# Patient Record
Sex: Male | Born: 1970
Health system: Southern US, Community
[De-identification: ages and names within clinical notes are randomized; demographics above are authoritative.]

## PROBLEM LIST (undated history)

## (undated) DIAGNOSIS — K222 Esophageal obstruction: Secondary | ICD-10-CM

## (undated) DIAGNOSIS — F32A Depression, unspecified: Secondary | ICD-10-CM

## (undated) DIAGNOSIS — K219 Gastro-esophageal reflux disease without esophagitis: Secondary | ICD-10-CM

## (undated) DIAGNOSIS — E7800 Pure hypercholesterolemia, unspecified: Secondary | ICD-10-CM

## (undated) DIAGNOSIS — I319 Disease of pericardium, unspecified: Secondary | ICD-10-CM

## (undated) HISTORY — DX: Disease of pericardium, unspecified: I31.9

## (undated) HISTORY — DX: Depression, unspecified: F32.A

## (undated) HISTORY — PX: UPPER GASTROINTESTINAL ENDOSCOPY: SHX188

## (undated) HISTORY — DX: Gastro-esophageal reflux disease without esophagitis: K21.9

## (undated) HISTORY — DX: Esophageal obstruction: K22.2

## (undated) HISTORY — PX: RHINOPLASTY: SUR1284

---

## 2001-10-12 ENCOUNTER — Emergency Department (HOSPITAL_COMMUNITY): Admission: EM | Admit: 2001-10-12 | Discharge: 2001-10-12 | Payer: Self-pay | Admitting: Emergency Medicine

## 2013-09-29 ENCOUNTER — Other Ambulatory Visit: Payer: Self-pay | Admitting: Family Medicine

## 2013-09-29 DIAGNOSIS — R1310 Dysphagia, unspecified: Secondary | ICD-10-CM

## 2013-10-06 ENCOUNTER — Other Ambulatory Visit: Payer: Self-pay

## 2016-05-08 DIAGNOSIS — M9901 Segmental and somatic dysfunction of cervical region: Secondary | ICD-10-CM | POA: Diagnosis not present

## 2016-05-08 DIAGNOSIS — M531 Cervicobrachial syndrome: Secondary | ICD-10-CM | POA: Diagnosis not present

## 2016-05-08 DIAGNOSIS — M5137 Other intervertebral disc degeneration, lumbosacral region: Secondary | ICD-10-CM | POA: Diagnosis not present

## 2016-05-08 DIAGNOSIS — M5032 Other cervical disc degeneration, mid-cervical region, unspecified level: Secondary | ICD-10-CM | POA: Diagnosis not present

## 2016-05-12 DIAGNOSIS — M531 Cervicobrachial syndrome: Secondary | ICD-10-CM | POA: Diagnosis not present

## 2016-05-12 DIAGNOSIS — M9902 Segmental and somatic dysfunction of thoracic region: Secondary | ICD-10-CM | POA: Diagnosis not present

## 2016-05-12 DIAGNOSIS — M9903 Segmental and somatic dysfunction of lumbar region: Secondary | ICD-10-CM | POA: Diagnosis not present

## 2016-05-12 DIAGNOSIS — M9901 Segmental and somatic dysfunction of cervical region: Secondary | ICD-10-CM | POA: Diagnosis not present

## 2016-05-16 DIAGNOSIS — M9901 Segmental and somatic dysfunction of cervical region: Secondary | ICD-10-CM | POA: Diagnosis not present

## 2016-05-16 DIAGNOSIS — M9902 Segmental and somatic dysfunction of thoracic region: Secondary | ICD-10-CM | POA: Diagnosis not present

## 2016-05-16 DIAGNOSIS — M531 Cervicobrachial syndrome: Secondary | ICD-10-CM | POA: Diagnosis not present

## 2016-05-16 DIAGNOSIS — M9903 Segmental and somatic dysfunction of lumbar region: Secondary | ICD-10-CM | POA: Diagnosis not present

## 2016-05-21 DIAGNOSIS — M9902 Segmental and somatic dysfunction of thoracic region: Secondary | ICD-10-CM | POA: Diagnosis not present

## 2016-05-21 DIAGNOSIS — M531 Cervicobrachial syndrome: Secondary | ICD-10-CM | POA: Diagnosis not present

## 2016-05-21 DIAGNOSIS — M9901 Segmental and somatic dysfunction of cervical region: Secondary | ICD-10-CM | POA: Diagnosis not present

## 2016-05-21 DIAGNOSIS — M9903 Segmental and somatic dysfunction of lumbar region: Secondary | ICD-10-CM | POA: Diagnosis not present

## 2016-05-26 DIAGNOSIS — M531 Cervicobrachial syndrome: Secondary | ICD-10-CM | POA: Diagnosis not present

## 2016-05-26 DIAGNOSIS — M9903 Segmental and somatic dysfunction of lumbar region: Secondary | ICD-10-CM | POA: Diagnosis not present

## 2016-05-26 DIAGNOSIS — M9902 Segmental and somatic dysfunction of thoracic region: Secondary | ICD-10-CM | POA: Diagnosis not present

## 2016-05-28 DIAGNOSIS — J343 Hypertrophy of nasal turbinates: Secondary | ICD-10-CM | POA: Diagnosis not present

## 2016-05-28 DIAGNOSIS — R1314 Dysphagia, pharyngoesophageal phase: Secondary | ICD-10-CM | POA: Diagnosis not present

## 2016-05-28 DIAGNOSIS — J342 Deviated nasal septum: Secondary | ICD-10-CM | POA: Insufficient documentation

## 2016-05-28 DIAGNOSIS — K219 Gastro-esophageal reflux disease without esophagitis: Secondary | ICD-10-CM | POA: Diagnosis not present

## 2016-05-28 DIAGNOSIS — J309 Allergic rhinitis, unspecified: Secondary | ICD-10-CM | POA: Insufficient documentation

## 2016-05-30 DIAGNOSIS — M531 Cervicobrachial syndrome: Secondary | ICD-10-CM | POA: Diagnosis not present

## 2016-05-30 DIAGNOSIS — M9902 Segmental and somatic dysfunction of thoracic region: Secondary | ICD-10-CM | POA: Diagnosis not present

## 2016-05-30 DIAGNOSIS — M9901 Segmental and somatic dysfunction of cervical region: Secondary | ICD-10-CM | POA: Diagnosis not present

## 2016-05-30 DIAGNOSIS — M9903 Segmental and somatic dysfunction of lumbar region: Secondary | ICD-10-CM | POA: Diagnosis not present

## 2016-06-19 DIAGNOSIS — K219 Gastro-esophageal reflux disease without esophagitis: Secondary | ICD-10-CM | POA: Diagnosis not present

## 2016-06-19 DIAGNOSIS — J342 Deviated nasal septum: Secondary | ICD-10-CM | POA: Diagnosis not present

## 2016-09-16 DIAGNOSIS — H52223 Regular astigmatism, bilateral: Secondary | ICD-10-CM | POA: Diagnosis not present

## 2016-09-16 DIAGNOSIS — H5203 Hypermetropia, bilateral: Secondary | ICD-10-CM | POA: Diagnosis not present

## 2016-09-16 DIAGNOSIS — H524 Presbyopia: Secondary | ICD-10-CM | POA: Diagnosis not present

## 2016-10-16 DIAGNOSIS — Z23 Encounter for immunization: Secondary | ICD-10-CM | POA: Diagnosis not present

## 2016-10-28 ENCOUNTER — Encounter (HOSPITAL_COMMUNITY): Payer: Self-pay | Admitting: *Deleted

## 2016-10-28 ENCOUNTER — Observation Stay (HOSPITAL_BASED_OUTPATIENT_CLINIC_OR_DEPARTMENT_OTHER): Payer: BLUE CROSS/BLUE SHIELD

## 2016-10-28 ENCOUNTER — Observation Stay (HOSPITAL_COMMUNITY)
Admission: EM | Admit: 2016-10-28 | Discharge: 2016-10-29 | Disposition: A | Discharge status: Home / Self Care | Payer: BLUE CROSS/BLUE SHIELD | Attending: Cardiovascular Disease | Admitting: Cardiovascular Disease

## 2016-10-28 ENCOUNTER — Ambulatory Visit (HOSPITAL_COMMUNITY): Admit: 2016-10-28 | Payer: Self-pay | Admitting: Cardiovascular Disease

## 2016-10-28 ENCOUNTER — Encounter (HOSPITAL_COMMUNITY): Admission: EM | Disposition: A | Discharge status: Home / Self Care | Payer: Self-pay | Source: Home / Self Care

## 2016-10-28 DIAGNOSIS — I319 Disease of pericardium, unspecified: Secondary | ICD-10-CM

## 2016-10-28 DIAGNOSIS — F419 Anxiety disorder, unspecified: Secondary | ICD-10-CM | POA: Diagnosis not present

## 2016-10-28 DIAGNOSIS — E7800 Pure hypercholesterolemia, unspecified: Secondary | ICD-10-CM | POA: Diagnosis not present

## 2016-10-28 DIAGNOSIS — R9431 Abnormal electrocardiogram [ECG] [EKG]: Secondary | ICD-10-CM | POA: Diagnosis present

## 2016-10-28 DIAGNOSIS — I309 Acute pericarditis, unspecified: Principal | ICD-10-CM

## 2016-10-28 DIAGNOSIS — Z7982 Long term (current) use of aspirin: Secondary | ICD-10-CM | POA: Diagnosis not present

## 2016-10-28 DIAGNOSIS — I2119 ST elevation (STEMI) myocardial infarction involving other coronary artery of inferior wall: Secondary | ICD-10-CM | POA: Diagnosis present

## 2016-10-28 DIAGNOSIS — I213 ST elevation (STEMI) myocardial infarction of unspecified site: Secondary | ICD-10-CM | POA: Diagnosis not present

## 2016-10-28 DIAGNOSIS — R079 Chest pain, unspecified: Secondary | ICD-10-CM | POA: Diagnosis not present

## 2016-10-28 DIAGNOSIS — Z79899 Other long term (current) drug therapy: Secondary | ICD-10-CM | POA: Insufficient documentation

## 2016-10-28 HISTORY — DX: Pure hypercholesterolemia, unspecified: E78.00

## 2016-10-28 HISTORY — PX: LEFT HEART CATH AND CORONARY ANGIOGRAPHY: CATH118249

## 2016-10-28 LAB — DIFFERENTIAL
BASOS ABS: 0 10*3/uL (ref 0.0–0.1)
BASOS PCT: 0 %
EOS ABS: 0.1 10*3/uL (ref 0.0–0.7)
Eosinophils Relative: 1 %
Lymphocytes Relative: 18 %
Lymphs Abs: 1.6 10*3/uL (ref 0.7–4.0)
Monocytes Absolute: 0.9 10*3/uL (ref 0.1–1.0)
Monocytes Relative: 10 %
NEUTROS PCT: 71 %
Neutro Abs: 6.2 10*3/uL (ref 1.7–7.7)

## 2016-10-28 LAB — ECHOCARDIOGRAM COMPLETE
HEIGHTINCHES: 70 in
WEIGHTICAEL: 2960 [oz_av]

## 2016-10-28 LAB — LIPID PANEL
CHOL/HDL RATIO: 3.6 ratio
CHOLESTEROL: 197 mg/dL (ref 0–200)
HDL: 54 mg/dL (ref >40)
LDL Cholesterol: 131 mg/dL — ABNORMAL HIGH (ref 0–99)
Triglycerides: 60 mg/dL (ref <150)
VLDL: 12 mg/dL (ref 0–40)

## 2016-10-28 LAB — COMPREHENSIVE METABOLIC PANEL
ALT: 16 U/L — ABNORMAL LOW (ref 17–63)
AST: 20 U/L (ref 15–41)
Albumin: 4.5 g/dL (ref 3.5–5.0)
Alkaline Phosphatase: 53 U/L (ref 38–126)
Anion gap: 11 (ref 5–15)
BUN: 16 mg/dL (ref 6–20)
CHLORIDE: 102 mmol/L (ref 101–111)
CO2: 26 mmol/L (ref 22–32)
Calcium: 9.9 mg/dL (ref 8.9–10.3)
Creatinine, Ser: 0.96 mg/dL (ref 0.61–1.24)
Glucose, Bld: 102 mg/dL — ABNORMAL HIGH (ref 65–99)
POTASSIUM: 4.2 mmol/L (ref 3.5–5.1)
Sodium: 139 mmol/L (ref 135–145)
Total Bilirubin: 0.8 mg/dL (ref 0.3–1.2)
Total Protein: 6.8 g/dL (ref 6.5–8.1)

## 2016-10-28 LAB — CBC
HCT: 45.5 % (ref 39.0–52.0)
HEMOGLOBIN: 15.3 g/dL (ref 13.0–17.0)
MCH: 29.4 pg (ref 26.0–34.0)
MCHC: 33.6 g/dL (ref 30.0–36.0)
MCV: 87.5 fL (ref 78.0–100.0)
Platelets: 208 10*3/uL (ref 150–400)
RBC: 5.2 MIL/uL (ref 4.22–5.81)
RDW: 13.2 % (ref 11.5–15.5)
WBC: 8.8 10*3/uL (ref 4.0–10.5)

## 2016-10-28 LAB — PROTIME-INR
INR: 0.99
Prothrombin Time: 13.1 seconds (ref 11.4–15.2)

## 2016-10-28 LAB — TROPONIN I: Troponin I: <0.03 ng/mL (ref <0.03)

## 2016-10-28 LAB — APTT: aPTT: 29 seconds (ref 24–36)

## 2016-10-28 LAB — BRAIN NATRIURETIC PEPTIDE: B Natriuretic Peptide: 10.7 pg/mL (ref 0.0–100.0)

## 2016-10-28 SURGERY — LEFT HEART CATH AND CORONARY ANGIOGRAPHY

## 2016-10-28 MED ORDER — ACETAMINOPHEN 325 MG PO TABS: 650.0000 mg | ORAL_TABLET | ORAL | Status: DC | PRN

## 2016-10-28 MED ORDER — IOPAMIDOL (ISOVUE-370) INJECTION 76%
INTRAVENOUS | Status: DC | PRN
  Administered 2016-10-28: 85 mL via INTRA_ARTERIAL

## 2016-10-28 MED ORDER — IBUPROFEN 600 MG PO TABS
600.0000 mg | ORAL_TABLET | Freq: Three times a day (TID) | ORAL | Status: DC
  Administered 2016-10-28 – 2016-10-29 (×3): 600 mg via ORAL
  Filled 2016-10-28 (×3): qty 1

## 2016-10-28 MED ORDER — ASPIRIN EC 81 MG PO TBEC
81.0000 mg | DELAYED_RELEASE_TABLET | Freq: Every day | ORAL | Status: DC
  Administered 2016-10-29: 81 mg via ORAL
  Filled 2016-10-28: qty 1

## 2016-10-28 MED ORDER — FENTANYL CITRATE (PF) 100 MCG/2ML IJ SOLN
INTRAMUSCULAR | Status: AC
  Filled 2016-10-28: qty 2

## 2016-10-28 MED ORDER — LIDOCAINE HCL (PF) 1 % IJ SOLN
INTRAMUSCULAR | Status: AC
  Filled 2016-10-28: qty 30

## 2016-10-28 MED ORDER — SODIUM CHLORIDE 0.9 % IV SOLN: 250.0000 mL | INTRAVENOUS | Status: DC | PRN

## 2016-10-28 MED ORDER — HEPARIN SODIUM (PORCINE) 1000 UNIT/ML IJ SOLN
INTRAMUSCULAR | Status: DC | PRN
  Administered 2016-10-28: 4000 [IU] via INTRAVENOUS

## 2016-10-28 MED ORDER — ASPIRIN 81 MG PO CHEW
324.0000 mg | CHEWABLE_TABLET | Freq: Once | ORAL | Status: AC
  Administered 2016-10-28: 324 mg via ORAL
  Filled 2016-10-28: qty 4

## 2016-10-28 MED ORDER — DIAZEPAM 5 MG PO TABS: 5.0000 mg | ORAL_TABLET | ORAL | Status: DC | PRN

## 2016-10-28 MED ORDER — HEPARIN (PORCINE) IN NACL 2-0.9 UNIT/ML-% IJ SOLN
INTRAMUSCULAR | Status: AC
  Filled 2016-10-28: qty 1000

## 2016-10-28 MED ORDER — FENTANYL CITRATE (PF) 100 MCG/2ML IJ SOLN
INTRAMUSCULAR | Status: DC | PRN
  Administered 2016-10-28: 25 ug via INTRAVENOUS

## 2016-10-28 MED ORDER — LIDOCAINE HCL (PF) 1 % IJ SOLN
INTRAMUSCULAR | Status: DC | PRN
  Administered 2016-10-28: 2 mL

## 2016-10-28 MED ORDER — HEPARIN SODIUM (PORCINE) 5000 UNIT/ML IJ SOLN
5000.0000 [IU] | Freq: Three times a day (TID) | INTRAMUSCULAR | Status: DC
  Administered 2016-10-28 – 2016-10-29 (×2): 5000 [IU] via SUBCUTANEOUS
  Filled 2016-10-28 (×2): qty 1

## 2016-10-28 MED ORDER — HEPARIN (PORCINE) IN NACL 2-0.9 UNIT/ML-% IJ SOLN
INTRAMUSCULAR | Status: DC | PRN
  Administered 2016-10-28: 1500 mL

## 2016-10-28 MED ORDER — HEPARIN SODIUM (PORCINE) 1000 UNIT/ML IJ SOLN
INTRAMUSCULAR | Status: AC
  Filled 2016-10-28: qty 1

## 2016-10-28 MED ORDER — SODIUM CHLORIDE 0.9 % WEIGHT BASED INFUSION: 1.0000 mL/kg/h | INTRAVENOUS | Status: AC

## 2016-10-28 MED ORDER — HEPARIN SODIUM (PORCINE) 5000 UNIT/ML IJ SOLN
4000.0000 [IU] | Freq: Once | INTRAMUSCULAR | Status: AC
  Administered 2016-10-28: 4000 [IU] via INTRAVENOUS

## 2016-10-28 MED ORDER — IOPAMIDOL (ISOVUE-370) INJECTION 76%
INTRAVENOUS | Status: AC
  Filled 2016-10-28: qty 125

## 2016-10-28 MED ORDER — MIDAZOLAM HCL 2 MG/2ML IJ SOLN
INTRAMUSCULAR | Status: DC | PRN
  Administered 2016-10-28: 2 mg via INTRAVENOUS

## 2016-10-28 MED ORDER — MORPHINE SULFATE (PF) 2 MG/ML IV SOLN
2.0000 mg | INTRAVENOUS | Status: DC | PRN
  Administered 2016-10-28 (×2): 2 mg via INTRAVENOUS
  Filled 2016-10-28: qty 2
  Filled 2016-10-28 (×2): qty 1

## 2016-10-28 MED ORDER — PANTOPRAZOLE SODIUM 40 MG PO TBEC
40.0000 mg | DELAYED_RELEASE_TABLET | Freq: Every day | ORAL | Status: DC
  Administered 2016-10-28 – 2016-10-29 (×2): 40 mg via ORAL
  Filled 2016-10-28 (×2): qty 1

## 2016-10-28 MED ORDER — ONDANSETRON HCL 4 MG/2ML IJ SOLN: 4.0000 mg | Freq: Four times a day (QID) | INTRAMUSCULAR | Status: DC | PRN

## 2016-10-28 MED ORDER — VERAPAMIL HCL 2.5 MG/ML IV SOLN
INTRAVENOUS | Status: AC
  Filled 2016-10-28: qty 2

## 2016-10-28 MED ORDER — SODIUM CHLORIDE 0.9% FLUSH: 3.0000 mL | INTRAVENOUS | Status: DC | PRN

## 2016-10-28 MED ORDER — HEPARIN (PORCINE) IN NACL 2-0.9 UNIT/ML-% IJ SOLN
INTRAMUSCULAR | Status: AC
  Filled 2016-10-28: qty 500

## 2016-10-28 MED ORDER — MIDAZOLAM HCL 2 MG/2ML IJ SOLN
INTRAMUSCULAR | Status: AC
Start: 2016-10-28 — End: 2016-10-28
  Filled 2016-10-28: qty 2

## 2016-10-28 MED ORDER — NITROGLYCERIN 0.4 MG SL SUBL: 0.4000 mg | SUBLINGUAL_TABLET | SUBLINGUAL | Status: DC | PRN

## 2016-10-28 MED ORDER — SODIUM CHLORIDE 0.9% FLUSH
3.0000 mL | Freq: Two times a day (BID) | INTRAVENOUS | Status: DC
  Administered 2016-10-28 – 2016-10-29 (×2): 3 mL via INTRAVENOUS

## 2016-10-28 MED ORDER — VERAPAMIL HCL 2.5 MG/ML IV SOLN
INTRAVENOUS | Status: DC | PRN
  Administered 2016-10-28: 10 mL via INTRA_ARTERIAL

## 2016-10-28 MED ORDER — NITROGLYCERIN 1 MG/10 ML FOR IR/CATH LAB
INTRA_ARTERIAL | Status: AC
  Filled 2016-10-28: qty 10

## 2016-10-28 MED ORDER — HEPARIN SODIUM (PORCINE) 5000 UNIT/ML IJ SOLN
INTRAMUSCULAR | Status: AC
  Filled 2016-10-28: qty 1

## 2016-10-28 SURGICAL SUPPLY — 11 items
CATH 5FR JL3.5 JR4 ANG PIG MP (CATHETERS) ×1 IMPLANT
DEVICE RAD COMP TR BAND LRG (VASCULAR PRODUCTS) ×1 IMPLANT
GLIDESHEATH SLEND SS 6F .021 (SHEATH) ×1 IMPLANT
GUIDEWIRE INQWIRE 1.5J.035X260 (WIRE) IMPLANT
INQWIRE 1.5J .035X260CM (WIRE) ×2
KIT ENCORE 26 ADVANTAGE (KITS) ×1 IMPLANT
KIT HEART LEFT (KITS) ×2 IMPLANT
PACK CARDIAC CATHETERIZATION (CUSTOM PROCEDURE TRAY) ×2 IMPLANT
SYR MEDRAD MARK V 150ML (SYRINGE) ×2 IMPLANT
TRANSDUCER W/STOPCOCK (MISCELLANEOUS) ×2 IMPLANT
TUBING CIL FLEX 10 FLL-RA (TUBING) ×2 IMPLANT

## 2016-10-28 NOTE — ED Triage Notes (Signed)
Pt. Reports having center chest pain waking him up around 100 am.  Pt. Describes the pain as a pressure and a heaviness.  Pt. Denies any radiating pain or any other symptoms.  Pt. Was able to go back to sleep, but this morning continues to have the pain and a deep breath increases the pain.  Skin is p/w/d.  Resp. E/u.  Alert and oriented X4.   ECG done in triage

## 2016-10-28 NOTE — Progress Notes (Signed)
  Echocardiogram 2D Echocardiogram has been performed.  Marc Weber 10/28/2016, 3:43 PM

## 2016-10-28 NOTE — ED Notes (Signed)
Marc Weber with stemi team at bedside

## 2016-10-28 NOTE — Progress Notes (Signed)
Patient c/o of pain in the chest 4/10. Cath clean from today. Called MD. New orders given and implemented. Will continue to monitor.   Karley Pho, Mervin Kung RN

## 2016-10-28 NOTE — Progress Notes (Signed)
TR BAND REMOVAL  LOCATION:    Right radial   DEFLATED PER PROTOCOL:    Yes.    TIME BAND OFF / DRESSING APPLIED:    1300p   SITE UPON ARRIVAL:    Level 0  SITE AFTER BAND REMOVAL:    Level 0  CIRCULATION SENSATION AND MOVEMENT:    Within Normal Limits   Yes.    COMMENTS:   Right radial 2+ pulse.  Pt denies any discomfort at this time.

## 2016-10-28 NOTE — ED Notes (Signed)
ED Provider at bedside. 

## 2016-10-28 NOTE — H&P (Signed)
History and Physical  Patient ID: ZENITH WILL MRN: [id number], SOB: 04-14-1971 46 y.o. Date of Encounter: 10/28/2016, 10:14 AM  Primary Physician: No primary care provider on file. Primary Cardiologist: new  Chief Complaint: Chest pain  HPI: 46 y.o. male w/ PMHx who presented to [facility] on 10/28/2016 with complaints of Chest pain. The patient reports intermittent chest discomfort now for the past few weeks. Last night he developed severe substernal chest discomfort that he has difficulty characterizing. His pain lasted approximately one hour then resolved. Pain recurred this morning and is ongoing. There is no associated shortness of breath. There was associated nausea but no vomiting. No diaphoresis, lightheadedness, or syncope. The patient has no history of coronary artery disease, hypertension, tobacco use, or family history of CAD. He thinks he has had hypercholesterolemia but has never been treated. On arrival to the emergency department his EKG demonstrated inferolateral ST elevation consistent with an acute STEMI and a code STEMI was activated. He presents now to the cardiac catheterization lab for emergency cardiac catheterization and possible PCI. He is chest pain-free at the time of my evaluation.   Past Medical History:  Diagnosis Date  . Hypercholesteremia      Surgical History:  Past Surgical History:  Procedure Laterality Date  . RHINOPLASTY       Home Meds: Prior to Admission medications   Not on File    Allergies: No Known Allergies  Social History   Social History  . Marital status: Single    Spouse name: N/A  . Number of children: N/A  . Years of education: N/A   Occupational History  . Not on file.   Social History Main Topics  . Smoking status: Not on file  . Smokeless tobacco: Not on file  . Alcohol use Not on file  . Drug use: Unknown  . Sexual activity: Not on file   Other Topics Concern  . Not on file   Social History  Narrative  . No narrative on file     Family Hx: negative for CAD in any first degree family members  Review of Systems: General: negative for chills, fever, night sweats or weight changes.  ENT: negative for rhinorrhea or epistaxis Cardiovascular:see HPI Dermatological: negative for rash Respiratory: negative for cough or wheezing GI: negative for vomiting, diarrhea, bright red blood per rectum, melena, or hematemesis GU: no hematuria, urgency, or frequency Neurologic: negative for visual changes, syncope, headache, or dizziness Heme: no easy bruising or bleeding Endo: negative for excessive thirst, thyroid disorder, or flushing Musculoskeletal: negative for joint pain or swelling, negative for myalgias All other systems reviewed and are otherwise negative except as noted above.  Physical Exam: Blood pressure 104/68, pulse 65, temperature 97.9 F (36.6 C), resp. rate 18, height 5\' 10"  (1.778 m), weight 185 lb (83.9 kg), SpO2 (!) 0 %. General: Well developed, well nourished, alert and oriented, in no acute distress. HEENT: Normocephalic, atraumatic, sclera anicteric Neck: Supple. Carotids 2+ without bruits. JVP normal Lungs: Clear bilaterally to auscultation without wheezes, rales, or rhonchi. Breathing is unlabored. Heart: RRR with normal S1 and S2. No murmurs, rubs, or gallops appreciated. Abdomen: Soft, non-tender, non-distended with normoactive bowel sounds. No hepatomegaly. No rebound/guarding. No obvious abdominal masses. Back: No CVA tenderness Msk:  Strength and tone appear normal for age. Extremities: No clubbing, cyanosis, or edema.  Distal pedal pulses are 2+ and equal bilaterally. Neuro: CNII-XII intact, moves all extremities spontaneously. Psych:  Responds to questions appropriately  with a normal affect. Skin: warm and dry without rash   Labs:   Lab Results  Component Value Date   WBC 8.8 10/28/2016   HGB 15.3 10/28/2016   HCT 45.5 10/28/2016   MCV 87.5  10/28/2016   PLT 208 10/28/2016   No results for input(s): NA, K, CL, CO2, BUN, CREATININE, CALCIUM, PROT, BILITOT, ALKPHOS, ALT, AST, GLUCOSE in the last 168 hours.  Invalid input(s): LABALBU No results for input(s): CKTOTAL, CKMB, TROPONINI in the last 72 hours. No results found for: CHOL, HDL, LDLCALC, TRIG No results found for: DDIMER  Radiology/Studies:  No results found.   EKG: NSR with inferior/anterolateral ST elevation  CARDIAC STUDIES: pending  ASSESSMENT AND PLAN:  46 year old gentleman with chest pain syndrome and EKG suggestive of inferolateral STEMI. Differential diagnosis includes acute pericarditis, coronary vasospasm, or typical ACS with plaque rupture event. Considering his fairly marked ST segment elevation, emergency cardiac catheterization and possible PCI is indicated. I have reviewed the risks, indications, and alternatives with the patient. Emergency implied consent is obtained. All questions are answered.  The patient has received heparin 4000 units in the emergency department. He is received chewable aspirin 324 mg. He is chest pain-free at the time of my evaluation in the cardiac catheterization lab. Anticipate serial cardiac markers, lipid panel tomorrow morning, and post-MI medical therapy as indicated. Further disposition pending cardiac catheterization results.  Deatra James MD 10/28/2016, 10:14 AM

## 2016-10-28 NOTE — Progress Notes (Addendum)
Patient oriented into the room.  TR band site level 0. Vitals cycling. No issues at present.   Eleaner Dibartolo, Mervin Kung RN   Time was

## 2016-10-28 NOTE — ED Provider Notes (Signed)
[facility] DEPT Provider Note   CSN: [id number] Arrival date & time: 10/28/16  0911     History   Chief Complaint Chief Complaint  Patient presents with  . Chest Pain    HPI Marc Weber is a 46 y.o. male.  HPI Patient developed chest pain that awakened him and the night at 1 AM. He reports it was a pressure and heaviness in the center of his chest under the sternum. He denies it radiated or other symptoms were associated. He reports that he did go back to sleep. He reports this morning when he got up however the pain was still present. He reports is somewhat worse with a deep breath. He denies syncope or lightheadedness. He denies history of prior chest pain or any medical problems. He does take 1 daily morning medication for stress and anxiety but no other medications. He did try ibuprofen yesterday evening and this morning for the pain. He denies any history of bleeding disorder, GI bleeding, stroke. History reviewed. No pertinent past medical history.  There are no active problems to display for this patient.   History reviewed. No pertinent surgical history.     Home Medications    Prior to Admission medications   Not on File    Family History No family history on file.  Social History Social History  Substance Use Topics  . Smoking status: Not on file  . Smokeless tobacco: Not on file  . Alcohol use Not on file     Allergies   Patient has no known allergies.   Review of Systems Review of Systems 10 Systems reviewed and are negative for acute change except as noted in the HPI.   Physical Exam Updated Vital Signs BP 133/87 (BP Location: Right Arm)   Pulse 70   Temp 97.9 F (36.6 C)   Ht 5\' 10"  (1.778 m)   Wt 185 lb (83.9 kg)   BMI 26.54 kg/m   Physical Exam  Constitutional: He is oriented to person, place, and time. He appears well-developed and well-nourished.  HENT:  Head: Normocephalic and atraumatic.  Eyes: Conjunctivae and EOM  are normal.  Neck: Neck supple.  Cardiovascular: Normal rate, regular rhythm and normal heart sounds.   No murmur heard. Pulmonary/Chest: Effort normal and breath sounds normal. No respiratory distress.  Abdominal: Soft. There is no tenderness.  Musculoskeletal: He exhibits no edema or tenderness.  Neurological: He is alert and oriented to person, place, and time. No cranial nerve deficit. He exhibits normal muscle tone. Coordination normal.  Skin: Skin is warm and dry.  Psychiatric: He has a normal mood and affect.  Nursing note and vitals reviewed.    ED Treatments / Results  Labs (all labs ordered are listed, but only abnormal results are displayed) Labs Reviewed  CBC  DIFFERENTIAL  PROTIME-INR  APTT  COMPREHENSIVE METABOLIC PANEL  TROPONIN I  LIPID PANEL    EKG  EKG Interpretation  Date/Time:  Tuesday October 28 2016 09:18:20 EST Ventricular Rate:  75 PR Interval:  148 QRS Duration: 106 QT Interval:  388 QTC Calculation: 433 R Axis:   57 Text Interpretation:   Critical Test Result: STEMI Normal sinus rhythm ST elevation consider inferolateral injury or acute infarct  ACUTE MI / STEMI  Abnormal ECG agree. no old comparison Confirmed by Johnney Killian, MD, Jeannie Done [phone]) on 10/28/2016 9:38:52 AM       Radiology No results found.  Procedures Procedures (including critical care time) CRITICAL CARE Performed by: Charlesetta Shanks  Total critical care time: 30 minutes  Critical care time was exclusive of separately billable procedures and treating other patients.  Critical care was necessary to treat or prevent imminent or life-threatening deterioration.  Critical care was time spent personally by me on the following activities: development of treatment plan with patient and/or surrogate as well as nursing, discussions with consultants, evaluation of patient's response to treatment, examination of patient, obtaining history from patient or surrogate, ordering and  performing treatments and interventions, ordering and review of laboratory studies, ordering and review of radiographic studies, pulse oximetry and re-evaluation of patient's condition. Medications Ordered in ED Medications  heparin 5000 UNIT/ML injection (not administered)  aspirin chewable tablet 324 mg (324 mg Oral Given 10/28/16 0930)  heparin injection 4,000 Units (4,000 Units Intravenous Given 10/28/16 0930)     Initial Impression / Assessment and Plan / ED Course  I have reviewed the triage vital signs and the nursing notes.  Pertinent labs & imaging results that were available during my care of the patient were reviewed by me and considered in my medical decision making (see chart for details).     Consult:code STEMI  Final Clinical Impressions(s) / ED Diagnoses   Final diagnoses:  ST elevation myocardial infarction (STEMI), unspecified artery [facility])   Patient presents with no past medical history and acute onset of chest pain that awoke him from sleep. EKG is positive for inferior lateral STEMI. Patient is alert and appropriate. He rates his chest pain at a 1 at this time. He does not exhibit respiratory distress or blood pressure instability. Aspirin and heparin are initiated and patient is transfer to cath lab. New Prescriptions There are no discharge medications for this patient.    Charlesetta Shanks, MD 10/28/16 [phone]

## 2016-10-29 ENCOUNTER — Encounter (HOSPITAL_COMMUNITY): Payer: Self-pay | Admitting: *Deleted

## 2016-10-29 ENCOUNTER — Observation Stay (HOSPITAL_COMMUNITY): Payer: BLUE CROSS/BLUE SHIELD

## 2016-10-29 DIAGNOSIS — E7800 Pure hypercholesterolemia, unspecified: Secondary | ICD-10-CM | POA: Diagnosis not present

## 2016-10-29 DIAGNOSIS — R0602 Shortness of breath: Secondary | ICD-10-CM | POA: Diagnosis not present

## 2016-10-29 DIAGNOSIS — I301 Infective pericarditis: Secondary | ICD-10-CM

## 2016-10-29 DIAGNOSIS — I309 Acute pericarditis, unspecified: Secondary | ICD-10-CM

## 2016-10-29 DIAGNOSIS — F419 Anxiety disorder, unspecified: Secondary | ICD-10-CM | POA: Diagnosis not present

## 2016-10-29 DIAGNOSIS — Z7982 Long term (current) use of aspirin: Secondary | ICD-10-CM | POA: Diagnosis not present

## 2016-10-29 LAB — HEMOGLOBIN A1C
HEMOGLOBIN A1C: 5.3 % (ref 4.8–5.6)
MEAN PLASMA GLUCOSE: 105 mg/dL

## 2016-10-29 LAB — CBC
HEMATOCRIT: 41.1 % (ref 39.0–52.0)
HEMOGLOBIN: 13.6 g/dL (ref 13.0–17.0)
MCH: 29.2 pg (ref 26.0–34.0)
MCHC: 33.1 g/dL (ref 30.0–36.0)
MCV: 88.4 fL (ref 78.0–100.0)
Platelets: 181 10*3/uL (ref 150–400)
RBC: 4.65 MIL/uL (ref 4.22–5.81)
RDW: 13.6 % (ref 11.5–15.5)
WBC: 6.2 10*3/uL (ref 4.0–10.5)

## 2016-10-29 LAB — BASIC METABOLIC PANEL
ANION GAP: 7 (ref 5–15)
BUN: 11 mg/dL (ref 6–20)
CHLORIDE: 105 mmol/L (ref 101–111)
CO2: 27 mmol/L (ref 22–32)
Calcium: 9 mg/dL (ref 8.9–10.3)
Creatinine, Ser: 0.9 mg/dL (ref 0.61–1.24)
GFR calc Af Amer: >60 mL/min (ref >60)
Glucose, Bld: 101 mg/dL — ABNORMAL HIGH (ref 65–99)
POTASSIUM: 3.9 mmol/L (ref 3.5–5.1)
SODIUM: 139 mmol/L (ref 135–145)

## 2016-10-29 LAB — LIPID PANEL
Cholesterol: 172 mg/dL (ref 0–200)
HDL: 48 mg/dL (ref >40)
LDL CALC: 115 mg/dL — AB (ref 0–99)
Total CHOL/HDL Ratio: 3.6 RATIO
Triglycerides: 46 mg/dL (ref <150)
VLDL: 9 mg/dL (ref 0–40)

## 2016-10-29 LAB — HIV ANTIBODY (ROUTINE TESTING W REFLEX): HIV Screen 4th Generation wRfx: NONREACTIVE

## 2016-10-29 LAB — POCT I-STAT, CHEM 8
BUN: 18 mg/dL (ref 6–20)
CALCIUM ION: 1.23 mmol/L (ref 1.15–1.40)
Chloride: 105 mmol/L (ref 101–111)
Creatinine, Ser: 0.8 mg/dL (ref 0.61–1.24)
Glucose, Bld: 113 mg/dL — ABNORMAL HIGH (ref 65–99)
HCT: 42 % (ref 39.0–52.0)
Hemoglobin: 14.3 g/dL (ref 13.0–17.0)
Potassium: 3.8 mmol/L (ref 3.5–5.1)
Sodium: 140 mmol/L (ref 135–145)
TCO2: 25 mmol/L (ref 0–100)

## 2016-10-29 LAB — TROPONIN I

## 2016-10-29 LAB — SEDIMENTATION RATE: Sed Rate: 17 mm/hr — ABNORMAL HIGH (ref 0–16)

## 2016-10-29 MED ORDER — IBUPROFEN 600 MG PO TABS: 600.0000 mg | ORAL_TABLET | Freq: Three times a day (TID) | ORAL | 0 refills | Status: DC

## 2016-10-29 MED ORDER — COLCHICINE 0.6 MG PO TABS
0.6000 mg | ORAL_TABLET | Freq: Every day | ORAL | Status: DC
  Administered 2016-10-29: 0.6 mg via ORAL
  Filled 2016-10-29: qty 1

## 2016-10-29 MED ORDER — COLCHICINE 0.6 MG PO TABS: 0.6000 mg | ORAL_TABLET | Freq: Every day | ORAL | 0 refills | Status: DC

## 2016-10-29 MED ORDER — TUBERCULIN PPD 5 UNIT/0.1ML ID SOLN
5.0000 [IU] | Freq: Once | INTRADERMAL | Status: DC
  Filled 2016-10-29: qty 0.1

## 2016-10-29 MED ORDER — OMEPRAZOLE 20 MG PO CPDR
20.0000 mg | DELAYED_RELEASE_CAPSULE | Freq: Every day | ORAL | 1 refills | Status: DC
Start: 2016-10-29 — End: 2017-09-14

## 2016-10-29 MED FILL — Nitroglycerin IV Soln 100 MCG/ML in D5W: INTRA_ARTERIAL | Qty: 10 | Status: AC

## 2016-10-29 NOTE — Discharge Instructions (Signed)
Radial Site Care °Refer to this sheet in the next few weeks. These instructions provide you with information about caring for yourself after your procedure. Your health care provider may also give you more specific instructions. Your treatment has been planned according to current medical practices, but problems sometimes occur. Call your health care provider if you have any problems or questions after your procedure. °What can I expect after the procedure? °After your procedure, it is typical to have the following: °· Bruising at the radial site that usually fades within 1-2 weeks. °· Blood collecting in the tissue (hematoma) that may be painful to the touch. It should usually decrease in size and tenderness within 1-2 weeks. °Follow these instructions at home: °· Take medicines only as directed by your health care provider. °· You may shower 24-48 hours after the procedure or as directed by your health care provider. Remove the bandage (dressing) and gently wash the site with plain soap and water. Pat the area dry with a clean towel. Do not rub the site, because this may cause bleeding. °· Do not take baths, swim, or use a hot tub until your health care provider approves. °· Check your insertion site every day for redness, swelling, or drainage. °· Do not apply powder or lotion to the site. °· Do not flex or bend the affected arm for 24 hours or as directed by your health care provider. °· Do not push or pull heavy objects with the affected arm for 24 hours or as directed by your health care provider. °· Do not lift over 10 lb (4.5 kg) for 5 days after your procedure or as directed by your health care provider. °· Ask your health care provider when it is okay to: °¨ Return to work or school. °¨ Resume usual physical activities or sports. °¨ Resume sexual activity. °· Do not drive home if you are discharged the same day as the procedure. Have someone else drive you. °· You may drive 24 hours after the procedure  unless otherwise instructed by your health care provider. °· Do not operate machinery or power tools for 24 hours after the procedure. °· If your procedure was done as an outpatient procedure, which means that you went home the same day as your procedure, a responsible adult should be with you for the first 24 hours after you arrive home. °· Keep all follow-up visits as directed by your health care provider. This is important. °Contact a health care provider if: °· You have a fever. °· You have chills. °· You have increased bleeding from the radial site. Hold pressure on the site. °Get help right away if: °· You have unusual pain at the radial site. °· You have redness, warmth, or swelling at the radial site. °· You have drainage (other than a small amount of blood on the dressing) from the radial site. °· The radial site is bleeding, and the bleeding does not stop after 30 minutes of holding steady pressure on the site. °· Your arm or hand becomes pale, cool, tingly, or numb. °This information is not intended to replace advice given to you by your health care provider. Make sure you discuss any questions you have with your health care provider. °Document Released: 09/20/2010 Document Revised: 01/24/2016 Document Reviewed: 03/06/2014 °Elsevier Interactive Patient Education © 2017 [employer] ° °

## 2016-10-29 NOTE — Progress Notes (Signed)
Progress Note  Patient Name: Marc Weber Date of Encounter: 10/29/2016  Primary Cardiologist: Burt Knack (new)  Subjective   Still with mild, lingering substernal discomfort. Much improved compared to admission symptoms. No set over the past 6 months he has had several flares of a similar type discomfort but never as intense as upon this admission.  He denies articular complaints, chills, fever, and other significant medical problems.  Inpatient Medications    Scheduled Meds: . aspirin EC  81 mg Oral Daily  . heparin  5,000 Units Subcutaneous Q8H  . ibuprofen  600 mg Oral TID  . pantoprazole  40 mg Oral Daily  . sodium chloride flush  3 mL Intravenous Q12H   Continuous Infusions:  PRN Meds: sodium chloride, acetaminophen, diazepam, morphine injection, nitroGLYCERIN, ondansetron (ZOFRAN) IV, sodium chloride flush   Vital Signs    Vitals:   10/28/16 1310 10/28/16 1337 10/28/16 2028 10/29/16 0447  BP: 112/62 (!) 103/53 108/72 100/60  Pulse: 76 67 61 62  Resp: 17 20 18 18   Temp:  98.2 F (36.8 C) 98.1 F (36.7 C) 98 F (36.7 C)  TempSrc:  Oral Oral Oral  SpO2: 97% 97% 97% 96%  Weight:      Height:        Intake/Output Summary (Last 24 hours) at 10/29/16 0726 Last data filed at 10/28/16 2100  Gross per 24 hour  Intake           819.42 ml  Output                0 ml  Net           819.42 ml   Filed Weights   10/28/16 0929  Weight: 185 lb (83.9 kg)    Telemetry    Normal sinus rhythm without ectopy - Personally Reviewed  ECG    Normal sinus rhythm with mild diffuse ST elevation. Marked improvement compared with the initial EKG done on 10/28/2016. - Personally Reviewed  Physical Exam  Healthy appearing young male GEN: No acute distress.   Neck: No JVD Cardiac: RRR, no murmurs, rubs, or gallops.  Respiratory: Clear to auscultation bilaterally. GI: Soft, nontender, non-distended  MS: No edema; No deformity. Neuro:  Nonfocal  Psych: Normal affect    Labs    Chemistry Recent Labs Lab 10/28/16 0925 10/29/16 0234  NA 139 139  K 4.2 3.9  CL 102 105  CO2 26 27  GLUCOSE 102* 101*  BUN 16 11  CREATININE 0.96 0.90  CALCIUM 9.9 9.0  PROT 6.8  --   ALBUMIN 4.5  --   AST 20  --   ALT 16*  --   ALKPHOS 53  --   BILITOT 0.8  --   GFRNONAA >60 >60  GFRAA >60 >60  ANIONGAP 11 7     Hematology Recent Labs Lab 10/28/16 0925 10/29/16 0234  WBC 8.8 6.2  RBC 5.20 4.65  HGB 15.3 13.6  HCT 45.5 41.1  MCV 87.5 88.4  MCH 29.4 29.2  MCHC 33.6 33.1  RDW 13.2 13.6  PLT 208 181    Cardiac Enzymes Recent Labs Lab 10/28/16 0925 10/28/16 1712 10/28/16 2135 10/29/16 0234  TROPONINI <0.03 <0.03 <0.03 <0.03   No results for input(s): TROPIPOC in the last 168 hours.   BNP Recent Labs Lab 10/28/16 1712  BNP 10.7     DDimer No results for input(s): DDIMER in the last 168 hours.   Radiology    No results found.  Cardiac Studies   Coronary angio 10/28/16: Diagnostic Diagram      Echocardiogram 10/28/16: Study Conclusions  - Left ventricle: The cavity size was normal. Systolic function was   normal. The estimated ejection fraction was in the range of 60%   to 65%. Wall motion was normal; there were no regional wall   motion abnormalities. Left ventricular diastolic function   parameters were normal. - Mitral valve: Mildly thickened leaflets with hypermobility of the   anterior leaflet. There is no significant regurgitation. - Left atrium: The atrium was mildly dilated. - Right atrium: The atrium was mildly dilated. - Inferior vena cava: The vessel was normal in size. The   respirophasic diameter changes were in the normal range (>= 50%),   consistent with normal central venous pressure. - Pericardium, extracardiac: There was no pericardial effusion.  Impressions:  - LVEF 60-65%, normal wall motion, normal diastolic function, mild   mitral leaflet thickening with hypermobility of the anterior   leaflet,  mild biatrial enlargement, no pericardial effusion or   obvious pericardial thickening.   Patient Profile     46 y.o. male with sudden onset of chest discomfort and EKG compatible with acute pericarditis.  Assessment & Plan    1. Acute pericarditis with improving EKG and symptoms on nonsteroidal anti-inflammatory therapy. Plan is to ambulate today and discharge later today if stable.  Will get a PA and lateral chest x-ray, draw an autoimmune panel, and add colchicine which should be continued for 3 months. 600 mg of ibuprofen 3 times daily with meals should be taken for 10 days. Would not require cardiology follow-up unless recurrent symptoms. Follow-up with PCP, Dr. Mechele Collin at [facility], Sinclair Grooms, MD  10/29/2016, 7:26 AM

## 2016-10-29 NOTE — Discharge Summary (Signed)
The patient has been seen in conjunction with Delos Haring, PAC. All aspects of care have been considered and discussed. The patient has been personally interviewed, examined, and all clinical data has been reviewed.   Acute pericarditis  Plan Ibuprofen(10 days) and low dose colchicine (3 months).  Serology and TB blood work completed. Results pending at discharge.  Discharge Summary    Patient ID: Marc Weber,  MRN: [id number], DOB/AGE: [birth year] 46 y.o.  Admit date: 10/28/2016 Discharge date: 10/29/2016  Primary Care Provider: Dr. Mechele Collin at [facility] Primary Cardiologist:  Burt Knack (new)   Discharge Diagnoses    Active Problems:   Acute pericarditis  Allergies No Known Allergies   History of Present Illness     46 y.o. male w/ PMHx who presented to [facility] on 10/28/2016 with complaints of Chest pain. The patient reported intermittent chest discomfort at that time and for the past few weeks. On 2/26 he developed severe substernal chest discomfort that he had difficulty characterizing. His pain lasted approximately one hour then resolved. Pain recurred Tuesday morning and was ongoing. There was no associated shortness of breath. There was associated nausea but no vomiting. No diaphoresis, lightheadedness, or syncope. The patient has no history of coronary artery disease, hypertension, tobacco use, or family history of CAD. He thinks he has had hypercholesterolemia but has never been treated. On arrival to the emergency department his EKG demonstrated inferolateral ST elevation consistent with an acute STEMI and a code STEMI was activated.   Hospital Course      He went for emergent cardiac catheterization lab for emergency cardiac catheterization which ultimately demonstrated patent coronary arteries. His symptoms were felt to be due to acute pericarditis. A 2D echo performed, LVEF 60-65% with mild mitral leaflet thickening with hypermobility of the  anterior leaflet, mild biatrial enlargement, otherwise normal. This morning she is having some mild chest discomfort but is much improved and has remained stable.  Dr. Linard Millers has seen the patient and feels he is ready for discharge home. Dr. Tamala Julian feels he does not require cardiology follow-up unless symptoms reoccur, otherwise he can follow-up with her PCP with [facility]. Plan is to start on a Colchicine and Ibuprofen regimen, TB and Autoimmune panel sent out. A work excuse note was provided as well. Discharge medications are listed below.   Consultants: None  _____________  Discharge Vitals Blood pressure 100/60, pulse 62, temperature 98 F (36.7 C), temperature source Oral, resp. rate 18, height 5\' 10"  (1.778 m), weight 185 lb (83.9 kg), SpO2 96 %.  Filed Weights   10/28/16 0929  Weight: 185 lb (83.9 kg)    Labs & Radiologic Studies     CBC  Recent Labs  10/28/16 0925 10/29/16 0234  WBC 8.8 6.2  NEUTROABS 6.2  --   HGB 15.3 13.6  HCT 45.5 41.1  MCV 87.5 88.4  PLT 208 0000000   Basic Metabolic Panel  Recent Labs  10/28/16 0925 10/29/16 0234  NA 139 139  K 4.2 3.9  CL 102 105  CO2 26 27  GLUCOSE 102* 101*  BUN 16 11  CREATININE 0.96 0.90  CALCIUM 9.9 9.0   Liver Function Tests  Recent Labs  10/28/16 0925  AST 20  ALT 16*  ALKPHOS 53  BILITOT 0.8  PROT 6.8  ALBUMIN 4.5   Cardiac Enzymes  Recent Labs  10/28/16 1712 10/28/16 2135 10/29/16 0234  TROPONINI <0.03 <0.03 <0.03   Hemoglobin A1C  Recent Labs  10/28/16 1712  HGBA1C 5.3   Fasting Lipid Panel  Recent Labs  10/29/16 0234  CHOL 172  HDL 48  LDLCALC 115*  TRIG 46  CHOLHDL 3.6    Diagnostic Studies/Procedures    Coronary angio 10/28/16: Diagnostic Diagram      Echocardiogram 10/28/16: Study Conclusions  - Left ventricle: The cavity size was normal. Systolic function was normal. The estimated ejection fraction was in the range of 60% to 65%. Wall motion was  normal; there were no regional wall motion abnormalities. Left ventricular diastolic function parameters were normal. - Mitral valve: Mildly thickened leaflets with hypermobility of the anterior leaflet. There is no significant regurgitation. - Left atrium: The atrium was mildly dilated. - Right atrium: The atrium was mildly dilated. - Inferior vena cava: The vessel was normal in size. The respirophasic diameter changes were in the normal range (>= 50%), consistent with normal central venous pressure. - Pericardium, extracardiac: There was no pericardial effusion.  Impressions:  - LVEF 60-65%, normal wall motion, normal diastolic function, mild mitral leaflet thickening with hypermobility of the anterior leaflet, mild biatrial enlargement, no pericardial effusion or obvious pericardial thickening.  _____________    Disposition   Pt is being discharged home today in good condition.  Follow-up Plans & Appointments    Autoimmune panel and TB tests pending.  Colchicine for 3 months and 600 mg of ibuprofen 3 times daily with meals should be taken for 10 days. She will  not require cardiology follow-up unless recurrent symptoms. Follow-up with PCP, Dr. Mechele Collin at [facility]  Discharge Instructions    Diet - low sodium heart healthy    Complete by:  As directed    Increase activity slowly    Complete by:  As directed    Treatment plan:  - Continue ibuprofen for 10 days (scheduled, NOT just as-needed). If you are still having symptoms, please call our office. Take this with food. If you notice any stomach upset or signs of gastrointestinal bleeding such as blood in stool or black stools, stop this medicine and call your doctor immediately  - Take omeprazole to protect your stomach while taking ibuprofen.  - Continue colchicine for 3 months.  Call [facility] at [phone] if any bleeding, swelling or drainage at cath site.  May shower, no  tub baths for 48 hours for groin sticks. No lifting over 5 pounds for 3 days.  No Driving for 3 days   May shower / Bathe    Complete by:  As directed      Discharge Medications   Allergies as of 10/29/2016   No Known Allergies     Medication List    TAKE these medications   colchicine 0.6 MG tablet Take 1 tablet (0.6 mg total) by mouth daily. For 3 months   ibuprofen 600 MG tablet Commonly known as:  ADVIL,MOTRIN Take 1 tablet (600 mg total) by mouth 3 (three) times daily with meals. For 10 days   omeprazole 20 MG capsule Commonly known as:  PRILOSEC Take 1 capsule (20 mg total) by mouth daily.   venlafaxine XR 75 MG 24 hr capsule Commonly known as:  EFFEXOR-XR Take 75 mg by mouth daily.        Outstanding Labs/Studies   None  Duration of Discharge Encounter   Greater than 30 minutes including physician time.  Kristopher Glee PA-C 10/29/2016, 10:27 AM

## 2016-10-29 NOTE — Patient Care Conference (Signed)
Patient discharged to home without belongings, IVs and tele removed. AVS given, teach back performed, all questions answered. Patient stable at time of discharge.

## 2016-10-30 ENCOUNTER — Telehealth: Payer: Self-pay | Admitting: Cardiovascular Disease

## 2016-10-30 LAB — RHEUMATOID FACTOR

## 2016-10-30 LAB — ANTI-DNA ANTIBODY, DOUBLE-STRANDED: ds DNA Ab: <1 IU/mL (ref 0–9)

## 2016-10-30 LAB — ANA W/REFLEX IF POSITIVE: ANA: NEGATIVE

## 2016-10-30 NOTE — Telephone Encounter (Signed)
Informed pt of lab results. Pt verbalized understanding. 

## 2016-10-30 NOTE — Telephone Encounter (Signed)
New Message     Please call patient was returning a call

## 2016-10-31 DIAGNOSIS — M778 Other enthesopathies, not elsewhere classified: Secondary | ICD-10-CM | POA: Diagnosis not present

## 2016-10-31 LAB — QUANTIFERON IN TUBE
QFT TB AG MINUS NIL VALUE: 0.01 IU/mL
QUANTIFERON MITOGEN VALUE: 6.74 IU/mL
QUANTIFERON TB AG VALUE: 0.02 IU/mL
QUANTIFERON TB GOLD: NEGATIVE
Quantiferon Nil Value: 0.01 IU/mL

## 2016-10-31 LAB — QUANTIFERON TB GOLD ASSAY (BLOOD)

## 2016-11-14 DIAGNOSIS — F321 Major depressive disorder, single episode, moderate: Secondary | ICD-10-CM | POA: Diagnosis not present

## 2016-11-14 DIAGNOSIS — I300 Acute nonspecific idiopathic pericarditis: Secondary | ICD-10-CM | POA: Diagnosis not present

## 2017-02-25 DIAGNOSIS — R05 Cough: Secondary | ICD-10-CM | POA: Diagnosis not present

## 2017-03-18 DIAGNOSIS — R05 Cough: Secondary | ICD-10-CM | POA: Diagnosis not present

## 2017-07-17 DIAGNOSIS — G8929 Other chronic pain: Secondary | ICD-10-CM | POA: Diagnosis not present

## 2017-07-17 DIAGNOSIS — M25511 Pain in right shoulder: Secondary | ICD-10-CM | POA: Diagnosis not present

## 2017-07-17 DIAGNOSIS — M7541 Impingement syndrome of right shoulder: Secondary | ICD-10-CM | POA: Diagnosis not present

## 2017-07-27 ENCOUNTER — Encounter: Payer: Self-pay | Admitting: Gastroenterology

## 2017-09-14 ENCOUNTER — Encounter (INDEPENDENT_AMBULATORY_CARE_PROVIDER_SITE_OTHER): Payer: Self-pay

## 2017-09-14 ENCOUNTER — Encounter: Payer: Self-pay | Admitting: Gastroenterology

## 2017-09-14 ENCOUNTER — Ambulatory Visit: Payer: BLUE CROSS/BLUE SHIELD | Admitting: Gastroenterology

## 2017-09-14 VITALS — BP 122/78 | HR 74 | Ht 67.0 in | Wt 200.4 lb

## 2017-09-14 DIAGNOSIS — R1310 Dysphagia, unspecified: Secondary | ICD-10-CM | POA: Diagnosis not present

## 2017-09-14 DIAGNOSIS — K219 Gastro-esophageal reflux disease without esophagitis: Secondary | ICD-10-CM | POA: Diagnosis not present

## 2017-09-14 NOTE — Patient Instructions (Signed)
You have been scheduled for an endoscopy. Please follow written instructions given to you at your visit today. If you use inhalers (even only as needed), please bring them with you on the day of your procedure. Your physician has requested that you go to www.startemmi.com and enter the access code given to you at your visit today. This web site gives a general overview about your procedure. However, you should still follow specific instructions given to you by our office regarding your preparation for the procedure.  Thank you for choosing me and [facility].  Malcolm T. Stark, Jr., MD., FACG  

## 2017-09-14 NOTE — Progress Notes (Signed)
History of Present Illness: This is a 47 year old male self referred for the evaluation of GERD and dysphagia. Dr. Janace Hoard suspected LPR at a visit in 06/2016.  Patient denies any ongoing reflux symptoms.  He relates progressively worsening solid food dysphagia over the past 2 years with episodes of self-induced vomiting.  He started taking Nexium several weeks ago and his dysphasia has resolved. Denies weight loss, abdominal pain, constipation, diarrhea, change in stool caliber, melena, hematochezia, nausea, vomiting, chest pain.    No Known Allergies Outpatient Medications Prior to Visit  Medication Sig Dispense Refill  . esomeprazole (NEXIUM) 20 MG capsule Take 20 mg by mouth daily at 12 noon.    . venlafaxine XR (EFFEXOR-XR) 75 MG 24 hr capsule Take 75 mg by mouth daily.  11  . colchicine 0.6 MG tablet Take 1 tablet (0.6 mg total) by mouth daily. For 3 months 90 tablet 0  . ibuprofen (ADVIL,MOTRIN) 600 MG tablet Take 1 tablet (600 mg total) by mouth 3 (three) times daily with meals. For 10 days 30 tablet 0  . omeprazole (PRILOSEC) 20 MG capsule Take 1 capsule (20 mg total) by mouth daily. 30 capsule 1   No facility-administered medications prior to visit.    Past Medical History:  Diagnosis Date  . Hypercholesteremia    Past Surgical History:  Procedure Laterality Date  . LEFT HEART CATH AND CORONARY ANGIOGRAPHY N/A 10/28/2016   Procedure: Left Heart Cath and Coronary Angiography;  Surgeon: Sherren Mocha, MD;  Location: [facility];  Service: Cardiovascular;  Laterality: N/A;  . RHINOPLASTY     Social History   Socioeconomic History  . Marital status: Single    Spouse name: None  . Number of children: None  . Years of education: None  . Highest education level: None  Social Needs  . Financial resource strain: None  . Food insecurity - worry: None  . Food insecurity - inability: None  . Transportation needs - medical: None  . Transportation needs - non-medical:  None  Occupational History  . None  Tobacco Use  . Smoking status: Former Research scientist (life sciences)  . Smokeless tobacco: Never Used  Substance and Sexual Activity  . Alcohol use: No    Frequency: Never    Comment: wine- rare  . Drug use: No  . Sexual activity: Yes  Other Topics Concern  . None  Social History Narrative  . None   History reviewed. No pertinent family history.     Review of Systems: Pertinent positive and negative review of systems were noted in the above HPI section. All other review of systems were otherwise negative.   Physical Exam: General: Well developed, well nourished, no acute distress Head: Normocephalic and atraumatic Eyes:  sclerae anicteric, EOMI Ears: Normal auditory acuity Mouth: No deformity or lesions Neck: Supple, no masses or thyromegaly Lungs: Clear throughout to auscultation Heart: Regular rate and rhythm; no murmurs, rubs or bruits Abdomen: Soft, non tender and non distended. No masses, hepatosplenomegaly or hernias noted. Normal Bowel sounds Rectal: not done Musculoskeletal: Symmetrical with no gross deformities  Skin: No lesions on visible extremities Pulses:  Normal pulses noted Extremities: No clubbing, cyanosis, edema or deformities noted Neurological: Alert oriented x 4, grossly nonfocal Cervical Nodes:  No significant cervical adenopathy Inguinal Nodes: No significant inguinal adenopathy Psychological:  Alert and cooperative. Normal mood and affect  Assessment and Recommendations:  1. Dysphasia, suspected GERD.  Rule out esophageal stricture, esophagitis, neoplasm.  Continue Nexium 20 mg  daily.  Schedule EGD with possible dilation. The risks (including bleeding, perforation, infection, missed lesions, medication reactions and possible hospitalization or surgery if complications occur), benefits, and alternatives to endoscopy with possible biopsy and possible dilation were discussed with the patient and they consent to proceed.   2. CRC  screening, average risk.  Colonoscopy at age 8 in June 2022

## 2017-09-18 ENCOUNTER — Encounter: Payer: Self-pay | Admitting: Gastroenterology

## 2017-09-18 ENCOUNTER — Ambulatory Visit (AMBULATORY_SURGERY_CENTER): Payer: BLUE CROSS/BLUE SHIELD | Admitting: Gastroenterology

## 2017-09-18 ENCOUNTER — Other Ambulatory Visit: Payer: Self-pay

## 2017-09-18 VITALS — BP 98/66 | HR 51 | Temp 97.8°F | Resp 13 | Ht 67.0 in | Wt 200.0 lb

## 2017-09-18 DIAGNOSIS — K222 Esophageal obstruction: Secondary | ICD-10-CM | POA: Diagnosis not present

## 2017-09-18 DIAGNOSIS — K219 Gastro-esophageal reflux disease without esophagitis: Secondary | ICD-10-CM

## 2017-09-18 DIAGNOSIS — R1310 Dysphagia, unspecified: Secondary | ICD-10-CM

## 2017-09-18 MED ORDER — SODIUM CHLORIDE 0.9 % IV SOLN: 500.0000 mL | Freq: Once | INTRAVENOUS | Status: DC

## 2017-09-18 NOTE — Progress Notes (Signed)
Pt's states no medical or surgical changes since previsit or office visit. 

## 2017-09-18 NOTE — Op Note (Signed)
[facility] Patient Name: Marc Weber Procedure Date: 09/18/2017 9:44 AM MRN: [id number] Endoscopist: Ladene Artist , MD Age: 47 Referring MD:  Date of Birth: Jan 23, 1971 Gender: Male Account #: [id number] Procedure:                Upper GI endoscopy Indications:              Dysphagia, Suspected gastroesophageal reflux disease Medicines:                Monitored Anesthesia Care Procedure:                Pre-Anesthesia Assessment:                           - Prior to the procedure, a History and Physical                            was performed, and patient medications and                            allergies were reviewed. The patient's tolerance of                            previous anesthesia was also reviewed. The risks                            and benefits of the procedure and the sedation                            options and risks were discussed with the patient.                            All questions were answered, and informed consent                            was obtained. Prior Anticoagulants: The patient has                            taken no previous anticoagulant or antiplatelet                            agents. ASA Grade Assessment: II - A patient with                            mild systemic disease. After reviewing the risks                            and benefits, the patient was deemed in                            satisfactory condition to undergo the procedure.                           After obtaining informed consent, the endoscope was  passed under direct vision. Throughout the                            procedure, the patient's blood pressure, pulse, and                            oxygen saturations were monitored continuously. The                            Endoscope was introduced through the mouth, and                            advanced to the second part of duodenum. The upper                            GI  endoscopy was accomplished without difficulty.                            The patient tolerated the procedure well. Scope In: Scope Out: Findings:                 Mucosal changes including longitudinal furrows were                            found in the mid esophagus and in the distal                            esophagus. Biopsies were taken with a cold forceps                            for histology.                           One mild benign-appearing, intrinsic stenosis was                            found at the gastroesophageal junction. This                            measured 1.3 cm (inner diameter) and was traversed.                            A guidewire was placed and the scope was withdrawn.                            Dilations were performed with Savary dilators with                            mild resistance at 15 mm and 16 mm. Estimated blood                            loss was minimal.  The exam of the esophagus was otherwise normal.                           The entire examined stomach was normal.                           The duodenal bulb and second portion of the                            duodenum were normal. Complications:            No immediate complications. Estimated Blood Loss:     Estimated blood loss was minimal. Impression:               - Esophageal mucosal changes suspicious for                            eosinophilic esophagitis. Biopsied.                           - Benign-appearing esophageal stenosis. Dilated.                           - Normal stomach.                           - Normal duodenal bulb and second portion of the                            duodenum. Recommendation:           - Patient has a contact number available for                            emergencies. The signs and symptoms of potential                            delayed complications were discussed with the                            patient. Return to  normal activities tomorrow.                            Written discharge instructions were provided to the                            patient.                           - Clear liquids for 2 hours then soft diet as                            tolerated today. Resume prior diet tomorrow.                           - Continue present medications.                           -  Await pathology results.                           - Return to GI office in 4-6 weeks. Ladene Artist, MD 09/18/2017 10:04:35 AM This report has been signed electronically.

## 2017-09-18 NOTE — Patient Instructions (Signed)
YOU HAD AN ENDOSCOPIC PROCEDURE TODAY AT [facility]:   Refer to the procedure report that was given to you for any specific questions about what was found during the examination.  If the procedure report does not answer your questions, please call your gastroenterologist to clarify.  If you requested that your care partner not be given the details of your procedure findings, then the procedure report has been included in a sealed envelope for you to review at your convenience later.  YOU SHOULD EXPECT: Some feelings of bloating in the abdomen. Passage of more gas than usual.  Walking can help get rid of the air that was put into your GI tract during the procedure and reduce the bloating. If you had a lower endoscopy (such as a colonoscopy or flexible sigmoidoscopy) you may notice spotting of blood in your stool or on the toilet paper. If you underwent a bowel prep for your procedure, you may not have a normal bowel movement for a few days.  Please Note:  You might notice some irritation and congestion in your nose or some drainage.  This is from the oxygen used during your procedure.  There is no need for concern and it should clear up in a day or so.  SYMPTOMS TO REPORT IMMEDIATELY:   Following upper endoscopy (EGD)  Vomiting of blood or coffee ground material  New chest pain or pain under the shoulder blades  Painful or persistently difficult swallowing  New shortness of breath  Fever of 100F or higher  Black, tarry-looking stools  For urgent or emergent issues, a gastroenterologist can be reached at any hour by calling [phone].   DIET:  Clear liquids for two hours then soft diet as tolerated today. Resume prior diet tomorrow. Drink plenty of fluids but you should avoid alcoholic beverages for 24 hours.  ACTIVITY:  You should plan to take it easy for the rest of today and you should NOT DRIVE or use heavy machinery until tomorrow (because of the sedation medicines  used during the test).    FOLLOW UP: Our staff will call the number listed on your records the next business day following your procedure to check on you and address any questions or concerns that you may have regarding the information given to you following your procedure. If we do not reach you, we will leave a message.  However, if you are feeling well and you are not experiencing any problems, there is no need to return our call.  We will assume that you have returned to your regular daily activities without incident.  If any biopsies were taken you will be contacted by phone or by letter within the next 1-3 weeks.  Please call us at [phone] if you have not heard about the biopsies in 3 weeks.  Clear liquids for two hours then soft diet as tolerated today. Resume prior diet tomorrow. Return to GI office in 4-6 weeks. Office will call with a follow up appointment. Esophagitis and Stricture (handout given) Post Esophageal Dilation Diet (handout given)     SIGNATURES/CONFIDENTIALITY: You and/or your care partner have signed paperwork which will be entered into your electronic medical record.  These signatures attest to the fact that that the information above on your After Visit Summary has been reviewed and is understood.  Full responsibility of the confidentiality of this discharge information lies with you and/or your care-partner.

## 2017-09-18 NOTE — Progress Notes (Signed)
Report to PACU, RN, vss, BBS= Clear.  

## 2017-09-21 ENCOUNTER — Telehealth: Payer: Self-pay

## 2017-09-21 ENCOUNTER — Telehealth: Payer: Self-pay | Admitting: *Deleted

## 2017-09-21 NOTE — Telephone Encounter (Signed)
Name identifier, left a message. 

## 2017-09-21 NOTE — Telephone Encounter (Signed)
Left message on 2nd f/u call 

## 2017-10-05 ENCOUNTER — Encounter: Payer: Self-pay | Admitting: Gastroenterology

## 2017-11-17 DIAGNOSIS — F321 Major depressive disorder, single episode, moderate: Secondary | ICD-10-CM | POA: Diagnosis not present

## 2018-01-11 ENCOUNTER — Encounter: Payer: Self-pay | Admitting: Physician Assistant

## 2018-01-11 ENCOUNTER — Ambulatory Visit: Payer: BLUE CROSS/BLUE SHIELD | Admitting: Physician Assistant

## 2018-01-11 ENCOUNTER — Encounter (INDEPENDENT_AMBULATORY_CARE_PROVIDER_SITE_OTHER): Payer: Self-pay

## 2018-01-11 VITALS — BP 108/70 | HR 87 | Ht 67.0 in | Wt 203.0 lb

## 2018-01-11 DIAGNOSIS — R1310 Dysphagia, unspecified: Secondary | ICD-10-CM

## 2018-01-11 DIAGNOSIS — K219 Gastro-esophageal reflux disease without esophagitis: Secondary | ICD-10-CM | POA: Diagnosis not present

## 2018-01-11 DIAGNOSIS — R1319 Other dysphagia: Secondary | ICD-10-CM

## 2018-01-11 MED ORDER — ESOMEPRAZOLE MAGNESIUM 40 MG PO CPDR: DELAYED_RELEASE_CAPSULE | ORAL | 11 refills | Status: DC

## 2018-01-11 NOTE — Progress Notes (Signed)
Subjective:    Patient ID: Marc Weber, male    DOB: 1970/12/26, 47 y.o.   MRN: [id number]  HPI Marc Weber is a pleasant 47 year old white male, known to Dr. Fuller Plan.  He was initially seen in our office in January 2019 with complaints of GERD and dysphasia.  At that time he had had some progressive worsening of solid food dysphagia over the previous 2 years with occasional episodes of regurgitation.  He had started OTC Nexium with improvement in symptoms. He underwent EGD on 09/18/2017 with finding of longitudinal furrows in the mid esophagus and a stricture in the GE junction measuring 1.3 cm.  This was traversed with the scope and then Savary dilated 15 to 16 mm.   Biopsies showed no increase in eosinophils to suggest eosinophilic esophagitis. Patient had continued the Nexium 20 mg over the next couple of months and then stopped taking it.  His symptoms recurred after a month or so.  He says he does not have bad heartburn or indigestion but has frequent throat clearing and a burning sensation higher up in his esophagus.  He has also noticed a few occasions of solid food dysphasia again.  He had one bad episode where he had to self-induced vomiting. He started himself back on OTC Nexium and is feeling better again at this point but says he still having some sensation of throat clearing and burning in the upper esophagus.  He feels that the dysphasia symptoms have improved. No complaints of abdominal discomfort changes in bowel habits melena or hematochezia.  Family history negative for colon cancer and polyps as far as he is aware.   Review of Systems Pertinent positive and negative review of systems were noted in the above HPI section.  All other review of systems was otherwise negative.  Outpatient Encounter Medications as of 01/11/2018  Medication Sig  . esomeprazole (NEXIUM) 20 MG capsule Take 20 mg by mouth daily at 12 noon.  . venlafaxine XR (EFFEXOR-XR) 75 MG 24 hr capsule Take 75 mg by  mouth daily.  Marland Kitchen esomeprazole (NEXIUM) 40 MG capsule Take 1 capsule by mouth every morning.   Facility-Administered Encounter Medications as of 01/11/2018  Medication  . 0.9 %  sodium chloride infusion   No Known Allergies Patient Active Problem List   Diagnosis Date Noted  . Acute pericarditis 10/29/2016   Social History   Socioeconomic History  . Marital status: Single    Spouse name: Not on file  . Number of children: Not on file  . Years of education: Not on file  . Highest education level: Not on file  Occupational History  . Not on file  Social Needs  . Financial resource strain: Not on file  . Food insecurity:    Worry: Not on file    Inability: Not on file  . Transportation needs:    Medical: Not on file    Non-medical: Not on file  Tobacco Use  . Smoking status: Former Research scientist (life sciences)  . Smokeless tobacco: Never Used  Substance and Sexual Activity  . Alcohol use: No    Frequency: Never    Comment: wine- rare  . Drug use: No  . Sexual activity: Yes  Lifestyle  . Physical activity:    Days per week: Not on file    Minutes per session: Not on file  . Stress: Not on file  Relationships  . Social connections:    Talks on phone: Not on file    Gets together: Not  on file    Attends religious service: Not on file    Active member of club or organization: Not on file    Attends meetings of clubs or organizations: Not on file    Relationship status: Not on file  . Intimate partner violence:    Fear of current or ex partner: Not on file    Emotionally abused: Not on file    Physically abused: Not on file    Forced sexual activity: Not on file  Other Topics Concern  . Not on file  Social History Narrative  . Not on file    Marc Weber's family history includes Alcohol abuse in his mother; Suicidality in his father.      Objective:    Vitals:   01/11/18 1338  BP: 108/70  Pulse: 87    Physical Exam; well-developed white male in no acute distress, pleasant  blood pressure 108/70 pulse 87, height 5 foot 7, weight 203, BMI 31.7.  HEENT nontraumatic normocephalic EOMI PERRLA sclera anicteric, Neuro psych alert and oriented, grossly nonfocal mood and affect appropriate, Not further examined today       Assessment & Plan:   #16 47 year old white male with chronic GERD and history of distal esophageal peptic stricture status post dilation January 2019. Has had recurrence of symptoms off medication and some recurrence of intermittent dysphasia as well which for the most part improves on Nexium.  #2 colon cancer surveillance-average risk and asymptomatic.  Patient asks about screening at age 47 which he had seen on TV,  Plan; we discussed a strict antireflux regimen and antireflux diet, and he was given copies of both. Start Nexium 40 mg p.o. every morning. Patient is asked to call back in 4 to 6 weeks after being on 40 mg Nexium if he is still having any intermittent dysphasia he may benefit from repeat EGD with dilation, otherwise he will continue Nexium 40 mg daily and plan to follow-up in 1 year. We also had discussion about relative risk benefit ratio of PPI therapy, if his symptoms are under good control in several months and he wants to drop back to 20 mg p.o. daily that will be fine.  Plan colonoscopy with Dr. Fuller Plan at age 47.  Rinaldo Macqueen S Donovyn Guidice PA-C 01/11/2018   Cc: Lujean Amel, MD

## 2018-01-11 NOTE — Patient Instructions (Signed)
We sent a prescription to your pharmacy  for Nexium 40 mg.   If symptoms are persisting call back in 4-6 weeks.   Follow up with Dr. Fuller Plan in one year or sooner as needed.   If you are age 47 or younger, your body mass index should be between 19-25. Your Body mass index is 31.79 kg/m. If this is out of the aformentioned range listed, please consider follow up with your Primary Care Provider.

## 2018-02-23 DIAGNOSIS — H5203 Hypermetropia, bilateral: Secondary | ICD-10-CM | POA: Diagnosis not present

## 2018-03-23 DIAGNOSIS — S60021A Contusion of right index finger without damage to nail, initial encounter: Secondary | ICD-10-CM | POA: Diagnosis not present

## 2018-04-12 DIAGNOSIS — M9903 Segmental and somatic dysfunction of lumbar region: Secondary | ICD-10-CM | POA: Diagnosis not present

## 2018-04-12 DIAGNOSIS — M5386 Other specified dorsopathies, lumbar region: Secondary | ICD-10-CM | POA: Diagnosis not present

## 2018-04-12 DIAGNOSIS — M9904 Segmental and somatic dysfunction of sacral region: Secondary | ICD-10-CM | POA: Diagnosis not present

## 2018-04-14 DIAGNOSIS — M9903 Segmental and somatic dysfunction of lumbar region: Secondary | ICD-10-CM | POA: Diagnosis not present

## 2018-04-14 DIAGNOSIS — M9904 Segmental and somatic dysfunction of sacral region: Secondary | ICD-10-CM | POA: Diagnosis not present

## 2018-04-14 DIAGNOSIS — M5386 Other specified dorsopathies, lumbar region: Secondary | ICD-10-CM | POA: Diagnosis not present

## 2018-04-14 DIAGNOSIS — M9902 Segmental and somatic dysfunction of thoracic region: Secondary | ICD-10-CM | POA: Diagnosis not present

## 2018-04-15 DIAGNOSIS — M9903 Segmental and somatic dysfunction of lumbar region: Secondary | ICD-10-CM | POA: Diagnosis not present

## 2018-04-15 DIAGNOSIS — M5386 Other specified dorsopathies, lumbar region: Secondary | ICD-10-CM | POA: Diagnosis not present

## 2018-04-15 DIAGNOSIS — M9904 Segmental and somatic dysfunction of sacral region: Secondary | ICD-10-CM | POA: Diagnosis not present

## 2018-04-15 DIAGNOSIS — M9902 Segmental and somatic dysfunction of thoracic region: Secondary | ICD-10-CM | POA: Diagnosis not present

## 2018-04-19 DIAGNOSIS — B029 Zoster without complications: Secondary | ICD-10-CM | POA: Diagnosis not present

## 2018-04-20 IMAGING — DX DG CHEST 2V
2 series · 2 of 2 positions shown · non-contrast
Comparison: None.

CLINICAL DATA: Shortness of breath today.  History of pericarditis.

EXAM:
CHEST  2 VIEW

[w chest pa]
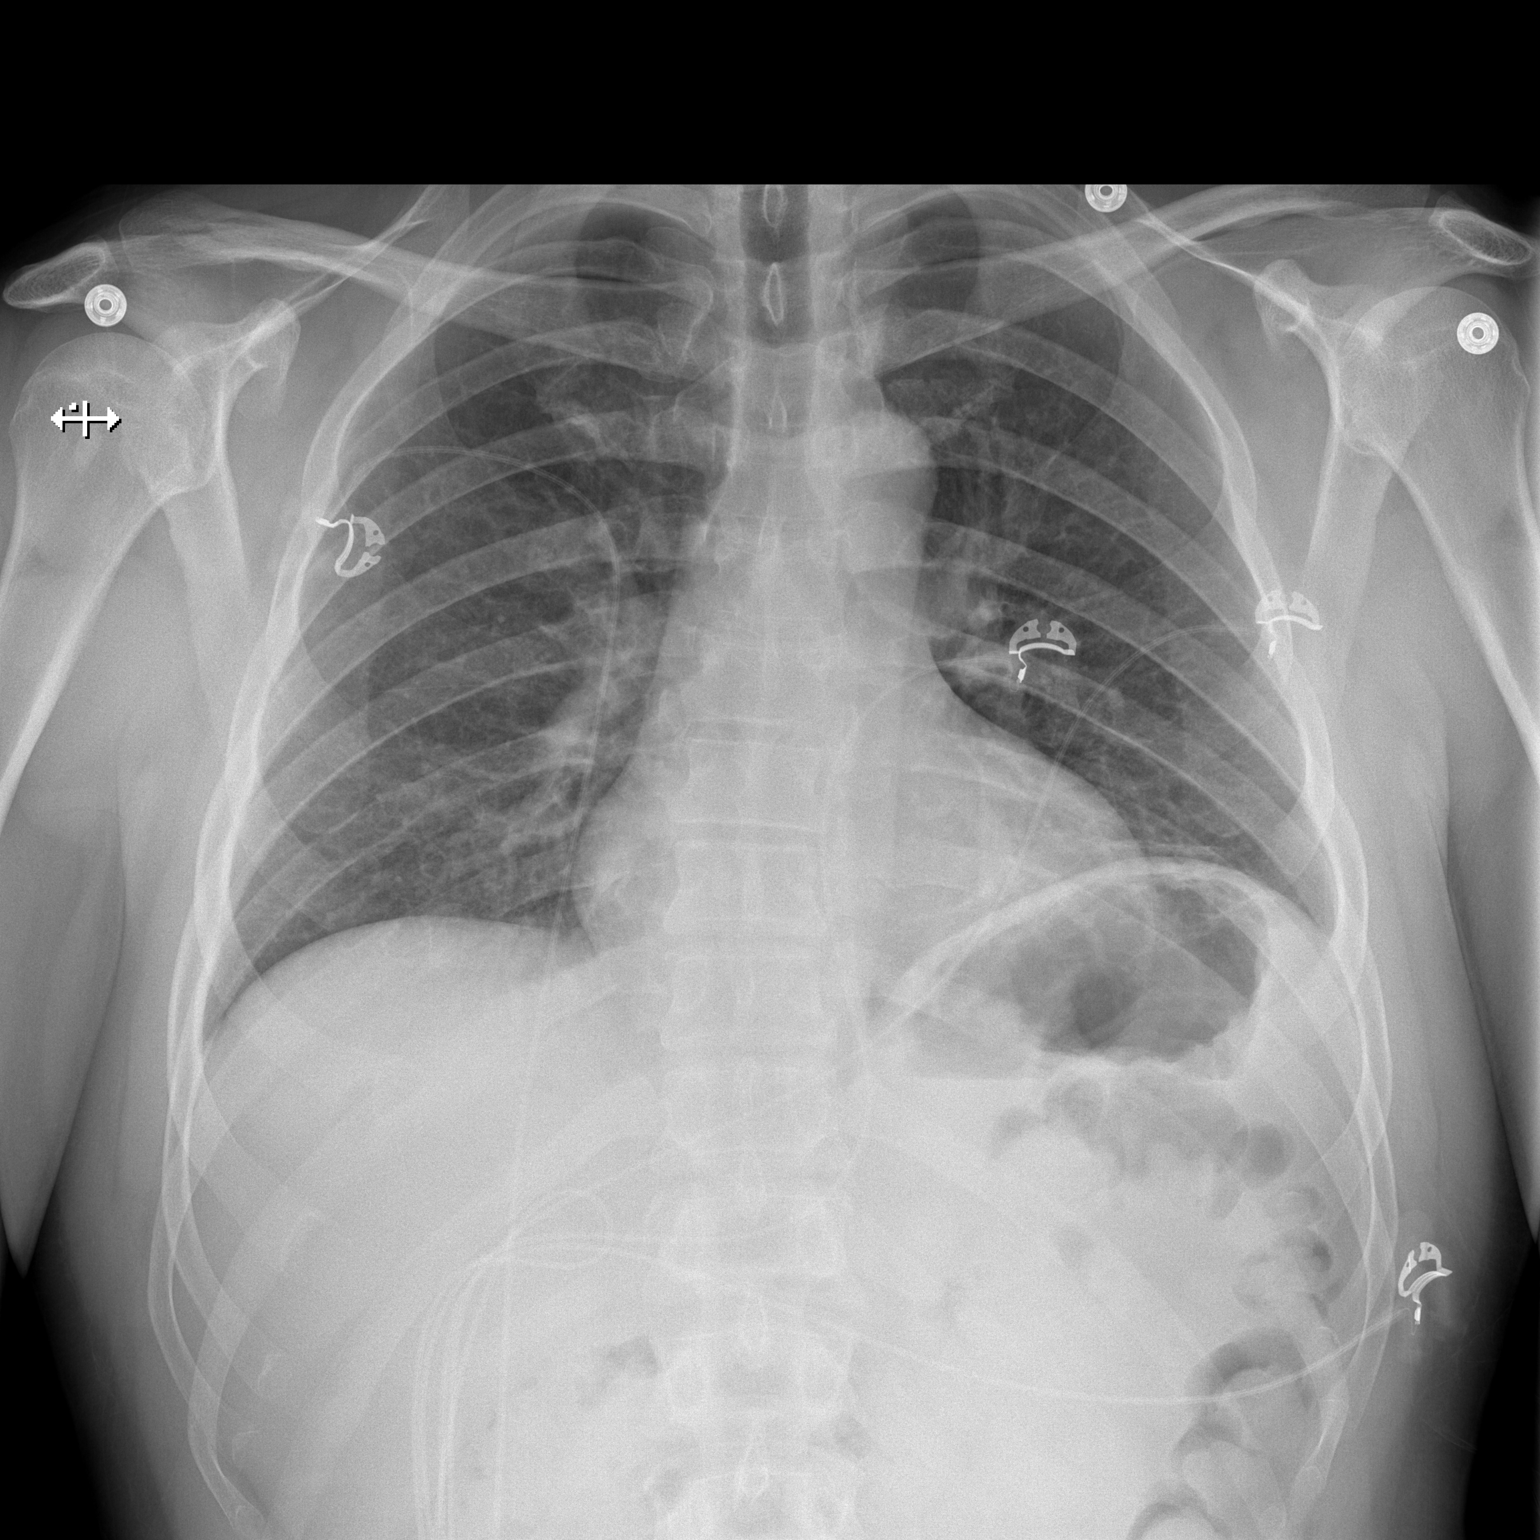

[w chest lat]
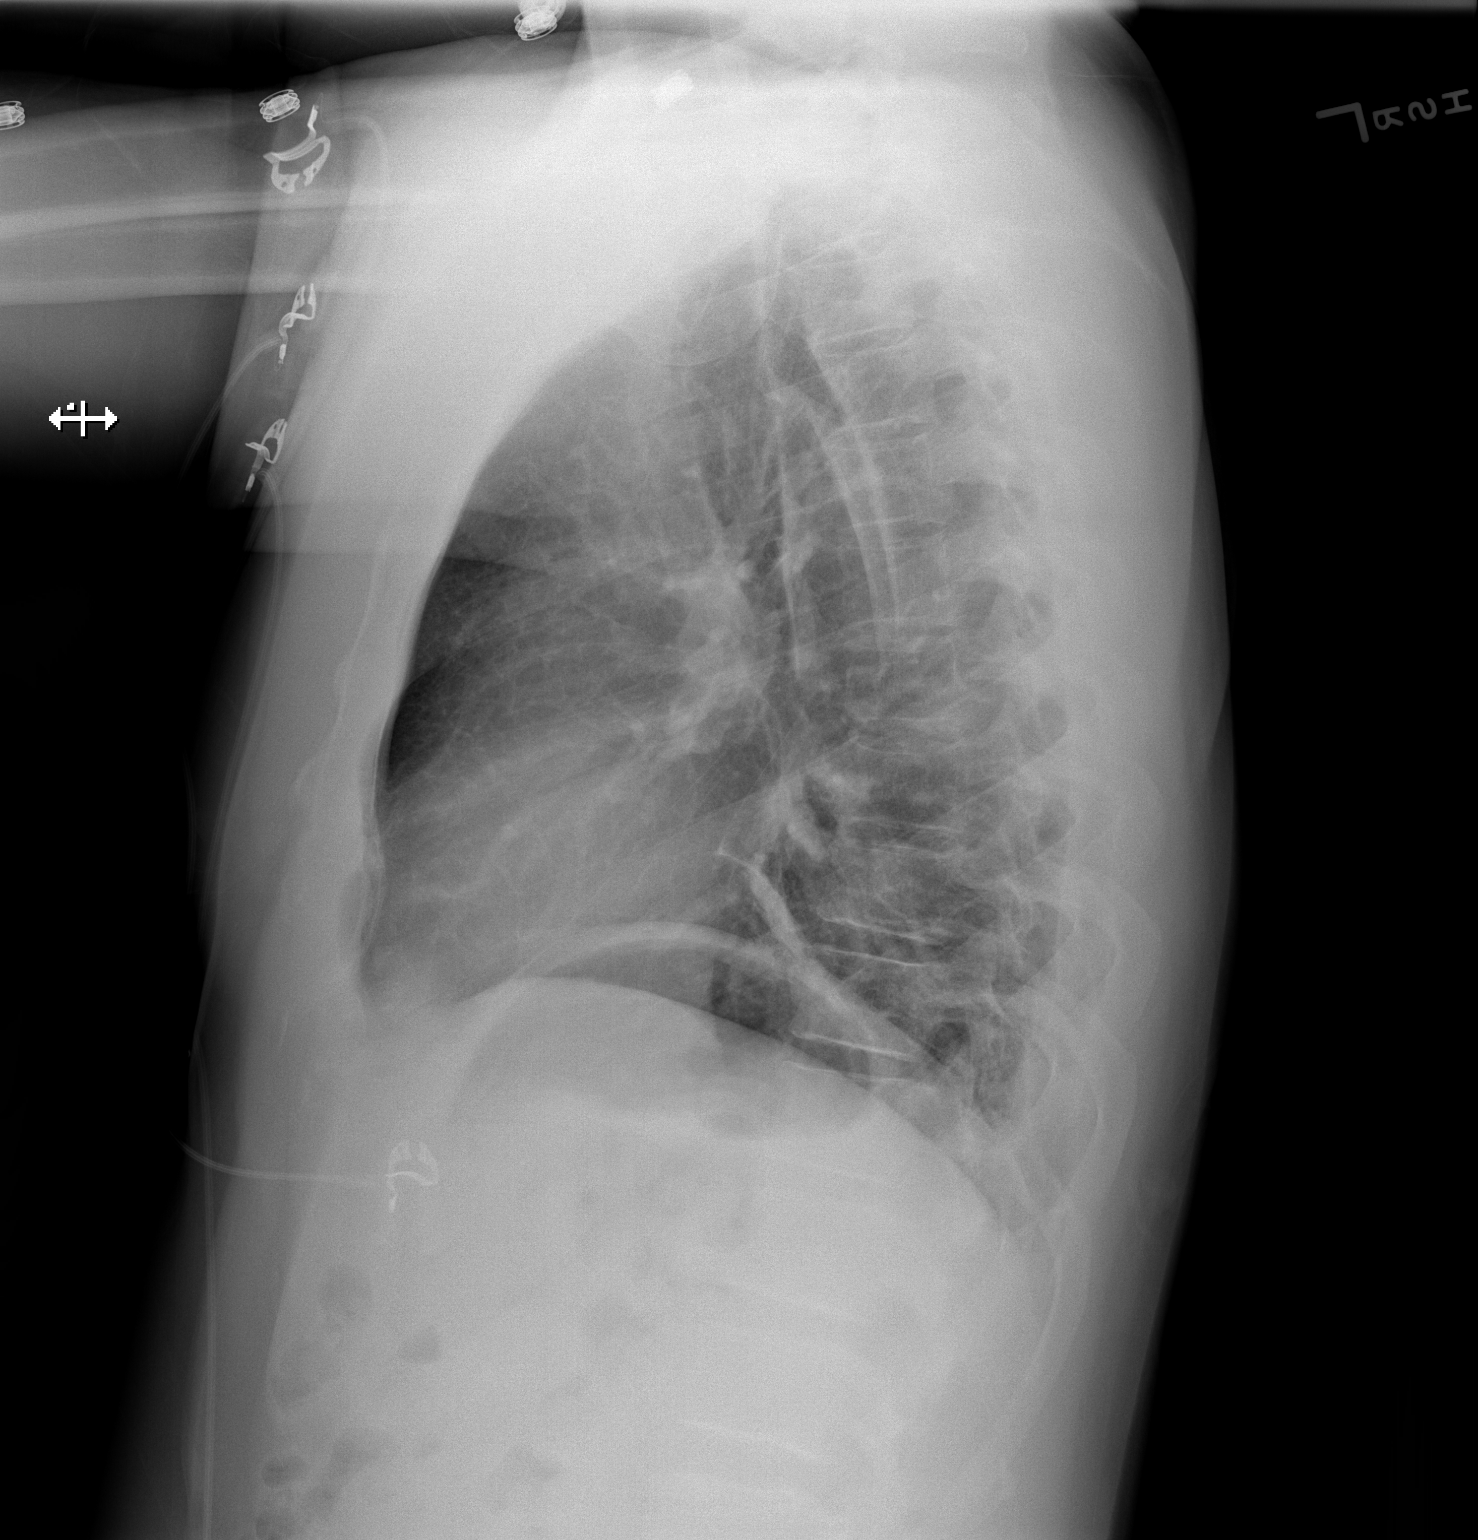

[2 of 2 positions shown; findings below may reference images not displayed]

FINDINGS: Cardiac silhouette is normal size and configuration. Mediastinal
shadows are normal. The right lung is clear. There are areas of
subsegmental atelectasis in the left lower lobe that could be
ordinary atelectasis or atelectatic pneumonia. No effusion. Bony
structures unremarkable.
IMPRESSION: Areas of subsegmental atelectasis in the left lower lobe. Etiology
indeterminate. Otherwise normal.

## 2018-04-21 DIAGNOSIS — H6091 Unspecified otitis externa, right ear: Secondary | ICD-10-CM | POA: Diagnosis not present

## 2018-05-13 DIAGNOSIS — Z23 Encounter for immunization: Secondary | ICD-10-CM | POA: Diagnosis not present

## 2018-05-13 DIAGNOSIS — H6692 Otitis media, unspecified, left ear: Secondary | ICD-10-CM | POA: Diagnosis not present

## 2019-01-14 ENCOUNTER — Other Ambulatory Visit: Payer: Self-pay | Admitting: Physician Assistant

## 2019-01-17 DIAGNOSIS — F321 Major depressive disorder, single episode, moderate: Secondary | ICD-10-CM | POA: Diagnosis not present

## 2019-02-11 ENCOUNTER — Other Ambulatory Visit: Payer: Self-pay | Admitting: Gastroenterology

## 2019-03-13 ENCOUNTER — Other Ambulatory Visit: Payer: Self-pay | Admitting: Gastroenterology

## 2019-04-10 ENCOUNTER — Other Ambulatory Visit: Payer: Self-pay | Admitting: Gastroenterology

## 2019-04-26 ENCOUNTER — Telehealth: Payer: Self-pay | Admitting: Gastroenterology

## 2019-04-26 ENCOUNTER — Other Ambulatory Visit: Payer: Self-pay | Admitting: Gastroenterology

## 2019-04-26 NOTE — Telephone Encounter (Signed)
Pt's wife requested a refill on Nexium--aware that pt needs an OV for future refills but declined to schedule OV due to COVID-19.  Wife asked to make an exception so pt can have medication.

## 2019-04-27 MED ORDER — ESOMEPRAZOLE MAGNESIUM 40 MG PO CPDR: 40.0000 mg | DELAYED_RELEASE_CAPSULE | Freq: Every morning | ORAL | 1 refills | Status: DC

## 2019-04-27 NOTE — Telephone Encounter (Signed)
OK. Schedule a virtual visit when appts are available.

## 2019-04-27 NOTE — Telephone Encounter (Signed)
Patient's wife states her and her husband are really concerned about coming into the office since covid-19 to schedule yearly f/u GERD visit. I informed patient and his wife that we are taking extra precautons to keep our patient's safe. Patient's wife still does not want to schedule at this time unless it's a virtual visit. Informed patient that it is hard for her husband to get the best care possible with a virtual visit. Patient's wife understand but still does not want to come into the office. Informed patient that we can schedule a virtual visit but at this time we do not have any more available appointments. Informed her we can call them back in a couple of weeks to schedule the virtual visit. In the mean time I will send in a refill of Nexium to her husbands pharmacy.  FYI Dr. Fuller Plan when I call in a couple of weeks can this be a virtual visit?

## 2019-05-10 ENCOUNTER — Telehealth: Payer: Self-pay

## 2019-05-10 NOTE — Telephone Encounter (Signed)
Left message for patient to return my call.

## 2019-05-10 NOTE — Telephone Encounter (Signed)
-----   Message from Marzella Schlein, [place] sent at 04/27/2019  9:12 AM EDT ----- See phone note from 04/26/19. Patient needs virtual visit scheduled for October when schedule becomes available.

## 2019-05-12 NOTE — Telephone Encounter (Signed)
Scheduled virtual follow up visit for 06/15/19 with patient. He verbalized understanding.

## 2019-06-14 ENCOUNTER — Other Ambulatory Visit: Payer: Self-pay

## 2019-06-15 ENCOUNTER — Other Ambulatory Visit: Payer: Self-pay

## 2019-06-15 ENCOUNTER — Encounter: Payer: Self-pay | Admitting: Gastroenterology

## 2019-06-15 ENCOUNTER — Ambulatory Visit (INDEPENDENT_AMBULATORY_CARE_PROVIDER_SITE_OTHER): Payer: BC Managed Care – PPO | Admitting: Gastroenterology

## 2019-06-15 VITALS — Ht 71.0 in | Wt 200.0 lb

## 2019-06-15 DIAGNOSIS — Z1212 Encounter for screening for malignant neoplasm of rectum: Secondary | ICD-10-CM

## 2019-06-15 DIAGNOSIS — K219 Gastro-esophageal reflux disease without esophagitis: Secondary | ICD-10-CM | POA: Diagnosis not present

## 2019-06-15 DIAGNOSIS — Z8719 Personal history of other diseases of the digestive system: Secondary | ICD-10-CM

## 2019-06-15 DIAGNOSIS — Z1211 Encounter for screening for malignant neoplasm of colon: Secondary | ICD-10-CM | POA: Diagnosis not present

## 2019-06-15 MED ORDER — ESOMEPRAZOLE MAGNESIUM 40 MG PO CPDR: 40.0000 mg | DELAYED_RELEASE_CAPSULE | Freq: Every morning | ORAL | 11 refills | Status: DC

## 2019-06-15 NOTE — Patient Instructions (Signed)
We have sent the following medications to your pharmacy for you to pick up at your convenience:Nexium.   Thank you for choosing me and [facility].  Malcolm T. Stark, Jr., MD., FACG   

## 2019-06-15 NOTE — Progress Notes (Signed)
    History of Present Illness: This is a 48 year old male with GERD with an esophageal stricture.  He underwent EGD with dilation in January 2019.  He has been maintained on Nexium 40 mg daily and had no active symptoms at all.  He ran out of medication and several days after stopping it he noted a return of heartburn and occasional difficulty swallowing.  He has now resumed medication and his symptoms have completely resolved.  No other gastrointestinal complaints.  Current Medications, Allergies, Past Medical History, Past Surgical History, Family History and Social History were reviewed in [employer] record.   Physical Exam: Not performed - telemedicine visit   Assessment and Recommendations:  1.  GERD with a history of an esophageal stricture.  Follow standard antireflux measures.  Continue Nexium 40 mg daily.  Refill for 1 year. REV in 1 year.  2.  CRC screening, average risk.  Screening colonoscopy is recommended at age 42, June 2022.   These services were provided via telemedicine, audio and video.  The patient was at home and the provider was in the office, alone.  We discussed the limitations of evaluation and management by telemedicine and the availability of in person appointments.  Patient consented for this telemedicine visit and is aware of possible charges for this service.  Office CMA or LPN participated in this telemedicine service.  Time spent on call: 5 minutes

## 2019-06-23 ENCOUNTER — Other Ambulatory Visit: Payer: Self-pay | Admitting: Gastroenterology

## 2019-11-03 DIAGNOSIS — R0981 Nasal congestion: Secondary | ICD-10-CM | POA: Diagnosis not present

## 2019-11-16 DIAGNOSIS — T485X5A Adverse effect of other anti-common-cold drugs, initial encounter: Secondary | ICD-10-CM | POA: Diagnosis not present

## 2019-11-16 DIAGNOSIS — R0683 Snoring: Secondary | ICD-10-CM | POA: Diagnosis not present

## 2019-11-16 DIAGNOSIS — J310 Chronic rhinitis: Secondary | ICD-10-CM | POA: Diagnosis not present

## 2019-11-16 DIAGNOSIS — J343 Hypertrophy of nasal turbinates: Secondary | ICD-10-CM | POA: Diagnosis not present

## 2019-12-21 DIAGNOSIS — J310 Chronic rhinitis: Secondary | ICD-10-CM | POA: Diagnosis not present

## 2019-12-21 DIAGNOSIS — R0981 Nasal congestion: Secondary | ICD-10-CM | POA: Diagnosis not present

## 2019-12-21 DIAGNOSIS — R0683 Snoring: Secondary | ICD-10-CM | POA: Diagnosis not present

## 2019-12-21 DIAGNOSIS — J3489 Other specified disorders of nose and nasal sinuses: Secondary | ICD-10-CM | POA: Diagnosis not present

## 2020-02-07 DIAGNOSIS — M950 Acquired deformity of nose: Secondary | ICD-10-CM | POA: Diagnosis not present

## 2020-02-07 DIAGNOSIS — J3489 Other specified disorders of nose and nasal sinuses: Secondary | ICD-10-CM | POA: Diagnosis not present

## 2020-02-07 DIAGNOSIS — J342 Deviated nasal septum: Secondary | ICD-10-CM | POA: Diagnosis not present

## 2020-02-07 DIAGNOSIS — J343 Hypertrophy of nasal turbinates: Secondary | ICD-10-CM | POA: Diagnosis not present

## 2020-06-06 DIAGNOSIS — Z0000 Encounter for general adult medical examination without abnormal findings: Secondary | ICD-10-CM | POA: Diagnosis not present

## 2020-06-06 DIAGNOSIS — E782 Mixed hyperlipidemia: Secondary | ICD-10-CM | POA: Diagnosis not present

## 2020-06-06 DIAGNOSIS — Z125 Encounter for screening for malignant neoplasm of prostate: Secondary | ICD-10-CM | POA: Diagnosis not present

## 2020-06-06 DIAGNOSIS — Z131 Encounter for screening for diabetes mellitus: Secondary | ICD-10-CM | POA: Diagnosis not present

## 2020-06-21 ENCOUNTER — Other Ambulatory Visit: Payer: Self-pay | Admitting: Gastroenterology

## 2020-07-18 ENCOUNTER — Other Ambulatory Visit: Payer: Self-pay | Admitting: Gastroenterology

## 2020-08-21 ENCOUNTER — Other Ambulatory Visit: Payer: Self-pay

## 2020-08-21 ENCOUNTER — Other Ambulatory Visit: Payer: Self-pay | Admitting: Gastroenterology

## 2020-08-21 ENCOUNTER — Ambulatory Visit: Payer: BC Managed Care – PPO | Admitting: *Deleted

## 2020-08-21 ENCOUNTER — Telehealth: Payer: Self-pay | Admitting: *Deleted

## 2020-08-21 VITALS — Ht 69.25 in | Wt 217.2 lb

## 2020-08-21 DIAGNOSIS — K219 Gastro-esophageal reflux disease without esophagitis: Secondary | ICD-10-CM

## 2020-08-21 DIAGNOSIS — Z1211 Encounter for screening for malignant neoplasm of colon: Secondary | ICD-10-CM

## 2020-08-21 MED ORDER — PLENVU 140 G PO SOLR
1.0000 | Freq: Once | ORAL | 0 refills | Status: AC
Start: 2020-08-21 — End: 2020-08-21

## 2020-08-21 MED ORDER — PANTOPRAZOLE SODIUM 40 MG PO TBEC: 40.0000 mg | DELAYED_RELEASE_TABLET | Freq: Every day | ORAL | 11 refills | Status: DC

## 2020-08-21 NOTE — Progress Notes (Signed)
Completed covid vaccines x3  Pt is aware that care partner will wait in the car during procedure; if they feel like they will be too hot or cold to wait in the car; they may wait in the 4 th floor lobby. Patient is aware to bring only one care partner. We want them to wear a mask (we do not have any that we can provide them), practice social distancing, and we will check their temperatures when they get here.  I did remind the patient that their care partner needs to stay in the parking lot the entire time and have a cell phone available, we will call them when the pt is ready for discharge. Patient will wear mask into building.   No trouble with anesthesia, difficulty with intubation or hx/fam hx of malignant hyperthermia per pt    No egg or soy allergy  No home oxygen use   No medications for weight loss taken  emmi information given  Pt denies constipation issues  Plenvu coupon given and code put into RX

## 2020-08-21 NOTE — Telephone Encounter (Signed)
Try pantoprazole 40 mg po qd, 1 year of refills.

## 2020-08-21 NOTE — Telephone Encounter (Signed)
Dr. Fuller Plan,  I saw this pt today for his PV.   He mentioned his insurance company is not covering his Nexium.  Could I send in another medication for him to try or would you like him to try OTC?  Please advise. Thanks, [employer]

## 2020-08-29 ENCOUNTER — Encounter: Payer: Self-pay | Admitting: Gastroenterology

## 2020-09-01 DIAGNOSIS — D126 Benign neoplasm of colon, unspecified: Secondary | ICD-10-CM

## 2020-09-01 HISTORY — DX: Benign neoplasm of colon, unspecified: D12.6

## 2020-09-05 ENCOUNTER — Ambulatory Visit (AMBULATORY_SURGERY_CENTER): Payer: BC Managed Care – PPO | Admitting: Gastroenterology

## 2020-09-05 ENCOUNTER — Other Ambulatory Visit: Payer: Self-pay

## 2020-09-05 ENCOUNTER — Encounter: Payer: Self-pay | Admitting: Gastroenterology

## 2020-09-05 VITALS — BP 117/69 | HR 56 | Temp 96.8°F | Resp 20 | Ht 69.0 in | Wt 217.0 lb

## 2020-09-05 DIAGNOSIS — K635 Polyp of colon: Secondary | ICD-10-CM

## 2020-09-05 DIAGNOSIS — D124 Benign neoplasm of descending colon: Secondary | ICD-10-CM

## 2020-09-05 DIAGNOSIS — D125 Benign neoplasm of sigmoid colon: Secondary | ICD-10-CM

## 2020-09-05 DIAGNOSIS — Z1211 Encounter for screening for malignant neoplasm of colon: Secondary | ICD-10-CM | POA: Diagnosis not present

## 2020-09-05 DIAGNOSIS — D123 Benign neoplasm of transverse colon: Secondary | ICD-10-CM

## 2020-09-05 MED ORDER — SODIUM CHLORIDE 0.9 % IV SOLN: 500.0000 mL | Freq: Once | INTRAVENOUS | Status: DC

## 2020-09-05 NOTE — Op Note (Signed)
[place] Patient Name: Marc Weber Procedure Date: 09/05/2020 10:20 AM MRN: [id number] Endoscopist: Ladene Artist , MD Age: 50 Referring MD:  Date of Birth: 02/03/1971 Gender: Male Account #: [id number] Procedure:                Colonoscopy Indications:              Screening for colorectal malignant neoplasm Medicines:                Monitored Anesthesia Care Procedure:                Pre-Anesthesia Assessment:                           - Prior to the procedure, a History and Physical                            was performed, and patient medications and                            allergies were reviewed. The patient's tolerance of                            previous anesthesia was also reviewed. The risks                            and benefits of the procedure and the sedation                            options and risks were discussed with the patient.                            All questions were answered, and informed consent                            was obtained. Prior Anticoagulants: The patient has                            taken no previous anticoagulant or antiplatelet                            agents. ASA Grade Assessment: II - A patient with                            mild systemic disease. After reviewing the risks                            and benefits, the patient was deemed in                            satisfactory condition to undergo the procedure.                           After obtaining informed consent, the colonoscope  was passed under direct vision. Throughout the                            procedure, the patient's blood pressure, pulse, and                            oxygen saturations were monitored continuously. The                            Olympus CF-HQ190L (Serial# [id number]) Colonoscope was                            introduced through the anus and advanced to the the                            cecum,  identified by appendiceal orifice and                            ileocecal valve. The ileocecal valve, appendiceal                            orifice, and rectum were photographed. The quality                            of the bowel preparation was excellent. The                            colonoscopy was performed without difficulty. The                            patient tolerated the procedure well. Scope In: 10:23:20 AM Scope Out: 10:45:05 AM Scope Withdrawal Time: 0 hours 19 minutes 9 seconds  Total Procedure Duration: 0 hours 21 minutes 45 seconds  Findings:                 The perianal and digital rectal examinations were                            normal.                           Six sessile polyps were found in the sigmoid colon                            (1), descending colon (2) and transverse colon (3).                            The polyps were 6 to 8 mm in size. These polyps                            were removed with a cold snare. Resection and                            retrieval were complete.  Internal hemorrhoids were found during                            retroflexion. The hemorrhoids were small and Grade                            I (internal hemorrhoids that do not prolapse).                           The exam was otherwise without abnormality on                            direct and retroflexion views. Complications:            No immediate complications. Estimated blood loss:                            None. Estimated Blood Loss:     Estimated blood loss: none. Impression:               - Six 6 to 8 mm polyps in the sigmoid colon, in the                            descending colon and in the transverse colon,                            removed with a cold snare. Resected and retrieved.                           - Internal hemorrhoids.                           - The examination was otherwise normal on direct                             and retroflexion views. Recommendation:           - Repeat colonoscopy after studies are complete for                            surveillance based on pathology results.                           - Patient has a contact number available for                            emergencies. The signs and symptoms of potential                            delayed complications were discussed with the                            patient. Return to normal activities tomorrow.                            Written discharge instructions  were provided to the                            patient.                           - Resume previous diet.                           - Continue present medications.                           - Await pathology results. Ladene Artist, MD 09/05/2020 10:50:01 AM This report has been signed electronically.

## 2020-09-05 NOTE — Patient Instructions (Signed)
Thank you for allowing [place] to care for you today!  Await pathology results of polyps removed, approximately 2 weeks by mail.  Will make recommendation for next colonoscopy at that time.  Resume previous diet and medications today.  Return to your normal activities tomorrow.       YOU HAD AN ENDOSCOPIC PROCEDURE TODAY AT THE [facility]:   Refer to the procedure report that was given to you for any specific questions about what was found during the examination.  If the procedure report does not answer your questions, please call your gastroenterologist to clarify.  If you requested that your care partner not be given the details of your procedure findings, then the procedure report has been included in a sealed envelope for you to review at your convenience later.  YOU SHOULD EXPECT: Some feelings of bloating in the abdomen. Passage of more gas than usual.  Walking can help get rid of the air that was put into your GI tract during the procedure and reduce the bloating. If you had a lower endoscopy (such as a colonoscopy or flexible sigmoidoscopy) you may notice spotting of blood in your stool or on the toilet paper. If you underwent a bowel prep for your procedure, you may not have a normal bowel movement for a few days.  Please Note:  You might notice some irritation and congestion in your nose or some drainage.  This is from the oxygen used during your procedure.  There is no need for concern and it should clear up in a day or so.  SYMPTOMS TO REPORT IMMEDIATELY:   Following lower endoscopy (colonoscopy or flexible sigmoidoscopy):  Excessive amounts of blood in the stool  Significant tenderness or worsening of abdominal pains  Swelling of the abdomen that is new, acute  Fever of 100F or higher    For urgent or emergent issues, a gastroenterologist can be reached at any hour by calling [phone]. Do not use MyChart messaging for urgent concerns.    DIET:  We do  recommend a small meal at first, but then you may proceed to your regular diet.  Drink plenty of fluids but you should avoid alcoholic beverages for 24 hours.  ACTIVITY:  You should plan to take it easy for the rest of today and you should NOT DRIVE or use heavy machinery until tomorrow (because of the sedation medicines used during the test).    FOLLOW UP: Our staff will call the number listed on your records 48-72 hours following your procedure to check on you and address any questions or concerns that you may have regarding the information given to you following your procedure. If we do not reach you, we will leave a message.  We will attempt to reach you two times.  During this call, we will ask if you have developed any symptoms of COVID 19. If you develop any symptoms (ie: fever, flu-like symptoms, shortness of breath, cough etc.) before then, please call [phone].  If you test positive for Covid 19 in the 2 weeks post procedure, please call and report this information to [place].    If any biopsies were taken you will be contacted by phone or by letter within the next 1-3 weeks.  Please call us at [phone] if you have not heard about the biopsies in 3 weeks.    SIGNATURES/CONFIDENTIALITY: You and/or your care partner have signed paperwork which will be entered into your electronic medical record.  These signatures attest  to the fact that that the information above on your After Visit Summary has been reviewed and is understood.  Full responsibility of the confidentiality of this discharge information lies with you and/or your care-partner. 

## 2020-09-05 NOTE — Progress Notes (Signed)
Report to PACU, RN, vss, BBS= Clear.  

## 2020-09-05 NOTE — Progress Notes (Signed)
Called to room to assist during endoscopic procedure.  Patient ID and intended procedure confirmed with present staff. Received instructions for my participation in the procedure from the performing physician.  

## 2020-09-07 ENCOUNTER — Telehealth: Payer: Self-pay

## 2020-09-07 NOTE — Telephone Encounter (Signed)
Left message on follow up call. 

## 2020-09-07 NOTE — Telephone Encounter (Signed)
  Follow up Call-  Call back number 09/05/2020  Post procedure Call Back phone  # [phone]  Permission to leave phone message Yes  Some recent data might be hidden     Patient questions:  Do you have a fever, pain , or abdominal swelling? No. Pain Score  0 *  Have you tolerated food without any problems? Yes.    Have you been able to return to your normal activities? Yes.    Do you have any questions about your discharge instructions: Diet   No. Medications  No. Follow up visit  No.  Do you have questions or concerns about your Care? No.  Actions: * If pain score is 4 or above: No action needed, pain <4.  1. Have you developed a fever since your procedure? no  2.   Have you had an respiratory symptoms (SOB or cough) since your procedure? no  3.   Have you tested positive for COVID 19 since your procedure no  4.   Have you had any family members/close contacts diagnosed with the COVID 19 since your procedure?  no   If yes to any of these questions please route to Joylene John, RN and Joella Prince, RN

## 2020-09-13 ENCOUNTER — Encounter: Payer: Self-pay | Admitting: Gastroenterology

## 2021-01-23 DIAGNOSIS — M19071 Primary osteoarthritis, right ankle and foot: Secondary | ICD-10-CM | POA: Diagnosis not present

## 2021-01-23 DIAGNOSIS — G5763 Lesion of plantar nerve, bilateral lower limbs: Secondary | ICD-10-CM | POA: Diagnosis not present

## 2021-01-23 DIAGNOSIS — M19072 Primary osteoarthritis, left ankle and foot: Secondary | ICD-10-CM | POA: Diagnosis not present

## 2021-01-23 DIAGNOSIS — M792 Neuralgia and neuritis, unspecified: Secondary | ICD-10-CM | POA: Diagnosis not present

## 2021-02-18 DIAGNOSIS — F5101 Primary insomnia: Secondary | ICD-10-CM | POA: Insufficient documentation

## 2021-02-18 DIAGNOSIS — E782 Mixed hyperlipidemia: Secondary | ICD-10-CM | POA: Insufficient documentation

## 2021-02-18 DIAGNOSIS — F321 Major depressive disorder, single episode, moderate: Secondary | ICD-10-CM | POA: Insufficient documentation

## 2021-02-19 ENCOUNTER — Encounter: Payer: Self-pay | Admitting: Gastroenterology

## 2021-02-19 ENCOUNTER — Ambulatory Visit: Payer: BC Managed Care – PPO | Admitting: Gastroenterology

## 2021-02-19 VITALS — BP 118/90 | HR 72 | Ht 69.0 in | Wt 214.0 lb

## 2021-02-19 DIAGNOSIS — K219 Gastro-esophageal reflux disease without esophagitis: Secondary | ICD-10-CM

## 2021-02-19 DIAGNOSIS — R1319 Other dysphagia: Secondary | ICD-10-CM | POA: Diagnosis not present

## 2021-02-19 NOTE — Progress Notes (Signed)
    History of Present Illness: This is a 50 year old male with worsening dysphagia.  He relates resolution of his solid food dysphagia after EGD with dilation in January 2019.  His reflux symptoms have been well controlled.  Over the past few months he has noted intermittent solid food dysphagia which has progressively worsened. One episode last evening with steak was quite painful and took longer to pass. No other gastrointestinal complaints.  Current Medications, Allergies, Past Medical History, Past Surgical History, Family History and Social History were reviewed in [employer] record.   Physical Exam: General: Well developed, well nourished, no acute distress Head: Normocephalic and atraumatic Eyes: Sclerae anicteric, EOMI Ears: Normal auditory acuity Mouth: Not examined, mask on during Covid-19 pandemic Lungs: Clear throughout to auscultation Heart: Regular rate and rhythm; no murmurs, rubs or bruits Abdomen: Soft, non tender and non distended. No masses, hepatosplenomegaly or hernias noted. Normal Bowel sounds Rectal: Not done Musculoskeletal: Symmetrical with no gross deformities  Pulses:  Normal pulses noted Extremities: No clubbing, cyanosis, edema or deformities noted Neurological: Alert oriented x 4, grossly nonfocal Psychological:  Alert and cooperative. Normal mood and affect   Assessment and Recommendations:  Dysphagia and GERD.  Suspected recurrent esophageal stricture.  Continue pantoprazole 40 mg daily.  Follow antireflux measures.  Schedule EGD with possible dilation. The risks (including bleeding, perforation, infection, missed lesions, medication reactions and possible hospitalization or surgery if complications occur), benefits, and alternatives to endoscopy with possible biopsy and possible dilation were discussed with the patient and they consent to proceed.    2.   Personal history of a sessile serrated polyp.  A 5-year interval  surveillance   colonoscopy is recommended in January 2027.

## 2021-02-19 NOTE — Patient Instructions (Signed)
You have been scheduled for an endoscopy. Please follow written instructions given to you at your visit today. If you use inhalers (even only as needed), please bring them with you on the day of your procedure.  Normal BMI (Body Mass Index- based on height and weight) is between 19 and 25. Your BMI today is Body mass index is 31.6 kg/m. Marland Kitchen Please consider follow up  regarding your BMI with your Primary Care Provider.  Due to recent changes in healthcare laws, you may see the results of your imaging and laboratory studies on MyChart before your provider has had a chance to review them.  We understand that in some cases there may be results that are confusing or concerning to you. Not all laboratory results come back in the same time frame and the provider may be waiting for multiple results in order to interpret others.  Please give [place] 48 hours in order for your provider to thoroughly review all the results before contacting the office for clarification of your results.   Thank you for choosing me and [facility].  Pricilla Riffle. Dagoberto Ligas., MD., Marval Regal

## 2021-02-20 DIAGNOSIS — R5383 Other fatigue: Secondary | ICD-10-CM | POA: Diagnosis not present

## 2021-02-20 DIAGNOSIS — M79671 Pain in right foot: Secondary | ICD-10-CM | POA: Diagnosis not present

## 2021-02-20 DIAGNOSIS — E538 Deficiency of other specified B group vitamins: Secondary | ICD-10-CM | POA: Diagnosis not present

## 2021-02-20 DIAGNOSIS — D529 Folate deficiency anemia, unspecified: Secondary | ICD-10-CM | POA: Diagnosis not present

## 2021-02-20 DIAGNOSIS — M5412 Radiculopathy, cervical region: Secondary | ICD-10-CM | POA: Diagnosis not present

## 2021-02-20 DIAGNOSIS — M5417 Radiculopathy, lumbosacral region: Secondary | ICD-10-CM | POA: Diagnosis not present

## 2021-02-20 DIAGNOSIS — R202 Paresthesia of skin: Secondary | ICD-10-CM | POA: Diagnosis not present

## 2021-02-20 DIAGNOSIS — G5601 Carpal tunnel syndrome, right upper limb: Secondary | ICD-10-CM | POA: Diagnosis not present

## 2021-02-20 DIAGNOSIS — M79672 Pain in left foot: Secondary | ICD-10-CM | POA: Diagnosis not present

## 2021-02-20 DIAGNOSIS — G603 Idiopathic progressive neuropathy: Secondary | ICD-10-CM | POA: Diagnosis not present

## 2021-02-20 DIAGNOSIS — E559 Vitamin D deficiency, unspecified: Secondary | ICD-10-CM | POA: Diagnosis not present

## 2021-03-05 DIAGNOSIS — M5417 Radiculopathy, lumbosacral region: Secondary | ICD-10-CM | POA: Diagnosis not present

## 2021-03-05 DIAGNOSIS — G5601 Carpal tunnel syndrome, right upper limb: Secondary | ICD-10-CM | POA: Diagnosis not present

## 2021-03-05 DIAGNOSIS — R202 Paresthesia of skin: Secondary | ICD-10-CM | POA: Diagnosis not present

## 2021-03-05 DIAGNOSIS — M542 Cervicalgia: Secondary | ICD-10-CM | POA: Diagnosis not present

## 2021-03-18 ENCOUNTER — Encounter: Payer: BC Managed Care – PPO | Admitting: Gastroenterology

## 2021-04-05 ENCOUNTER — Encounter: Payer: Self-pay | Admitting: Gastroenterology

## 2021-04-05 ENCOUNTER — Other Ambulatory Visit: Payer: Self-pay

## 2021-04-05 ENCOUNTER — Ambulatory Visit (AMBULATORY_SURGERY_CENTER): Payer: BC Managed Care – PPO | Admitting: Gastroenterology

## 2021-04-05 VITALS — BP 117/74 | HR 64 | Temp 98.2°F | Resp 16 | Ht 69.0 in | Wt 214.0 lb

## 2021-04-05 DIAGNOSIS — K222 Esophageal obstruction: Secondary | ICD-10-CM

## 2021-04-05 DIAGNOSIS — R1310 Dysphagia, unspecified: Secondary | ICD-10-CM | POA: Diagnosis not present

## 2021-04-05 DIAGNOSIS — R1319 Other dysphagia: Secondary | ICD-10-CM

## 2021-04-05 MED ORDER — SODIUM CHLORIDE 0.9 % IV SOLN: 500.0000 mL | Freq: Once | INTRAVENOUS | Status: DC

## 2021-04-05 NOTE — Progress Notes (Signed)
To PACU, VSS. Report to [email or web address] 

## 2021-04-05 NOTE — Patient Instructions (Signed)
Impression/Recommendations:  Dilation diet handout given to patient. Clear liquid diet for 2 hours, then advance to soft diet as tolerated today. Resume previous diet tomorrow.  Continue present medications, including Pantoprazole 40 mg. By mouth daily.  GI office appointment in 1 year.  YOU HAD AN ENDOSCOPIC PROCEDURE TODAY AT [facility]:   Refer to the procedure report that was given to you for any specific questions about what was found during the examination.  If the procedure report does not answer your questions, please call your gastroenterologist to clarify.  If you requested that your care partner not be given the details of your procedure findings, then the procedure report has been included in a sealed envelope for you to review at your convenience later.  YOU SHOULD EXPECT: Some feelings of bloating in the abdomen. Passage of more gas than usual.  Walking can help get rid of the air that was put into your GI tract during the procedure and reduce the bloating. If you had a lower endoscopy (such as a colonoscopy or flexible sigmoidoscopy) you may notice spotting of blood in your stool or on the toilet paper. If you underwent a bowel prep for your procedure, you may not have a normal bowel movement for a few days.  Please Note:  You might notice some irritation and congestion in your nose or some drainage.  This is from the oxygen used during your procedure.  There is no need for concern and it should clear up in a day or so.  SYMPTOMS TO REPORT IMMEDIATELY: Following upper endoscopy (EGD)  Vomiting of blood or coffee ground material  New chest pain or pain under the shoulder blades  Painful or persistently difficult swallowing  New shortness of breath  Fever of 100F or higher  Black, tarry-looking stools  For urgent or emergent issues, a gastroenterologist can be reached at any hour by calling [phone]. Do not use MyChart messaging for urgent concerns.     DIET:  We do recommend a small meal at first, but then you may proceed to your regular diet.  Drink plenty of fluids but you should avoid alcoholic beverages for 24 hours.  ACTIVITY:  You should plan to take it easy for the rest of today and you should NOT DRIVE or use heavy machinery until tomorrow (because of the sedation medicines used during the test).    FOLLOW UP: Our staff will call the number listed on your records 48-72 hours following your procedure to check on you and address any questions or concerns that you may have regarding the information given to you following your procedure. If we do not reach you, we will leave a message.  We will attempt to reach you two times.  During this call, we will ask if you have developed any symptoms of COVID 19. If you develop any symptoms (ie: fever, flu-like symptoms, shortness of breath, cough etc.) before then, please call [phone].  If you test positive for Covid 19 in the 2 weeks post procedure, please call and report this information to [place].    If any biopsies were taken you will be contacted by phone or by letter within the next 1-3 weeks.  Please call us at [phone] if you have not heard about the biopsies in 3 weeks.    SIGNATURES/CONFIDENTIALITY: You and/or your care partner have signed paperwork which will be entered into your electronic medical record.  These signatures attest to the fact that that the  information above on your After Visit Summary has been reviewed and is understood.  Full responsibility of the confidentiality of this discharge information lies with you and/or your care-partner.

## 2021-04-05 NOTE — Progress Notes (Signed)
VS taken by C.W. 

## 2021-04-05 NOTE — Op Note (Signed)
[name] Procedure Date: 04/05/2021 10:01 AM MRN: [id number] Endoscopist: Ladene Artist , MD Age: 50 Referring MD:  Date of Birth: 08/22/1971 Gender: Male Account #: [id number] Procedure:                Upper GI endoscopy Indications:              Dysphagia Medicines:                Monitored Anesthesia Care Procedure:                Pre-Anesthesia Assessment:                           - Prior to the procedure, a History and Physical                            was performed, and patient medications and                            allergies were reviewed. The patient's tolerance of                            previous anesthesia was also reviewed. The risks                            and benefits of the procedure and the sedation                            options and risks were discussed with the patient.                            All questions were answered, and informed consent                            was obtained. Prior Anticoagulants: The patient has                            taken no previous anticoagulant or antiplatelet                            agents. ASA Grade Assessment: II - A patient with                            mild systemic disease. After reviewing the risks                            and benefits, the patient was deemed in                            satisfactory condition to undergo the procedure.                           After obtaining informed consent, the endoscope was  passed under direct vision. Throughout the                            procedure, the patient's blood pressure, pulse, and                            oxygen saturations were monitored continuously. The                            GIF [id number] was introduced through the                            mouth, and advanced to the second part of duodenum.                            The upper GI endoscopy was accomplished  without                            difficulty. The patient tolerated the procedure                            well. Scope In: Scope Out: Findings:                 One benign-appearing, intrinsic moderate stenosis                            was found at the gastroesophageal junction. This                            stenosis measured 1.3 cm (inner diameter) x less                            than one cm (in length). The stenosis was                            traversed. A guidewire was placed and the scope was                            withdrawn. Dilation was performed with a Savary                            dilator with mild resistance at 16 mm.                           The exam of the esophagus was otherwise normal.                           The entire examined stomach was normal.                           The duodenal bulb and second portion of the  duodenum were normal. Complications:            No immediate complications. Estimated Blood Loss:     Estimated blood loss: none. Impression:               - Benign-appearing esophageal stenosis. Dilated.                           - Normal stomach.                           - Normal duodenal bulb and second portion of the                            duodenum.                           - No specimens collected. Recommendation:           - Patient has a contact number available for                            emergencies. The signs and symptoms of potential                            delayed complications were discussed with the                            patient. Return to normal activities tomorrow.                            Written discharge instructions were provided to the                            patient.                           - Clear liquid diet for 2 hours, then advance as                            tolerated to soft diet today.                           - Resume prior diet tomorrow.                            - Continue present medications including                            pantoprazole 40 mg po qd.                           - GI office appt in 1 year. Ladene Artist, MD 04/05/2021 10:27:46 AM This report has been signed electronically.

## 2021-04-09 ENCOUNTER — Telehealth: Payer: Self-pay

## 2021-04-09 ENCOUNTER — Telehealth: Payer: Self-pay | Admitting: *Deleted

## 2021-04-09 NOTE — Telephone Encounter (Signed)
Follow up call made. 

## 2021-04-09 NOTE — Telephone Encounter (Signed)
First post procedure follow up call, no answer 

## 2021-04-15 DIAGNOSIS — M9902 Segmental and somatic dysfunction of thoracic region: Secondary | ICD-10-CM | POA: Diagnosis not present

## 2021-04-15 DIAGNOSIS — M5386 Other specified dorsopathies, lumbar region: Secondary | ICD-10-CM | POA: Diagnosis not present

## 2021-04-15 DIAGNOSIS — M9903 Segmental and somatic dysfunction of lumbar region: Secondary | ICD-10-CM | POA: Diagnosis not present

## 2021-04-15 DIAGNOSIS — M9904 Segmental and somatic dysfunction of sacral region: Secondary | ICD-10-CM | POA: Diagnosis not present

## 2021-04-23 DIAGNOSIS — M9904 Segmental and somatic dysfunction of sacral region: Secondary | ICD-10-CM | POA: Diagnosis not present

## 2021-04-23 DIAGNOSIS — M5417 Radiculopathy, lumbosacral region: Secondary | ICD-10-CM | POA: Diagnosis not present

## 2021-04-23 DIAGNOSIS — M5386 Other specified dorsopathies, lumbar region: Secondary | ICD-10-CM | POA: Diagnosis not present

## 2021-04-23 DIAGNOSIS — M9903 Segmental and somatic dysfunction of lumbar region: Secondary | ICD-10-CM | POA: Diagnosis not present

## 2021-04-30 DIAGNOSIS — M9903 Segmental and somatic dysfunction of lumbar region: Secondary | ICD-10-CM | POA: Diagnosis not present

## 2021-04-30 DIAGNOSIS — M9904 Segmental and somatic dysfunction of sacral region: Secondary | ICD-10-CM | POA: Diagnosis not present

## 2021-04-30 DIAGNOSIS — M5386 Other specified dorsopathies, lumbar region: Secondary | ICD-10-CM | POA: Diagnosis not present

## 2021-04-30 DIAGNOSIS — M5417 Radiculopathy, lumbosacral region: Secondary | ICD-10-CM | POA: Diagnosis not present

## 2021-05-28 DIAGNOSIS — M545 Low back pain, unspecified: Secondary | ICD-10-CM | POA: Diagnosis not present

## 2021-06-09 DIAGNOSIS — M5451 Vertebrogenic low back pain: Secondary | ICD-10-CM | POA: Diagnosis not present

## 2021-06-24 DIAGNOSIS — M5136 Other intervertebral disc degeneration, lumbar region: Secondary | ICD-10-CM | POA: Diagnosis not present

## 2021-08-19 ENCOUNTER — Other Ambulatory Visit: Payer: Self-pay | Admitting: Gastroenterology

## 2021-08-19 DIAGNOSIS — K219 Gastro-esophageal reflux disease without esophagitis: Secondary | ICD-10-CM

## 2021-10-02 DIAGNOSIS — H6690 Otitis media, unspecified, unspecified ear: Secondary | ICD-10-CM | POA: Diagnosis not present

## 2021-10-15 DIAGNOSIS — H93293 Other abnormal auditory perceptions, bilateral: Secondary | ICD-10-CM | POA: Diagnosis not present

## 2021-10-15 DIAGNOSIS — Z87891 Personal history of nicotine dependence: Secondary | ICD-10-CM | POA: Diagnosis not present

## 2021-10-15 DIAGNOSIS — H9121 Sudden idiopathic hearing loss, right ear: Secondary | ICD-10-CM | POA: Diagnosis not present

## 2022-02-17 DIAGNOSIS — Z23 Encounter for immunization: Secondary | ICD-10-CM | POA: Diagnosis not present

## 2022-02-17 DIAGNOSIS — R030 Elevated blood-pressure reading, without diagnosis of hypertension: Secondary | ICD-10-CM | POA: Diagnosis not present

## 2022-02-17 DIAGNOSIS — H60311 Diffuse otitis externa, right ear: Secondary | ICD-10-CM | POA: Diagnosis not present

## 2022-03-28 ENCOUNTER — Encounter: Payer: Self-pay | Admitting: Gastroenterology

## 2022-07-03 DIAGNOSIS — M25521 Pain in right elbow: Secondary | ICD-10-CM | POA: Diagnosis not present

## 2022-07-08 DIAGNOSIS — M25521 Pain in right elbow: Secondary | ICD-10-CM | POA: Diagnosis not present

## 2022-08-20 ENCOUNTER — Other Ambulatory Visit: Payer: Self-pay | Admitting: Gastroenterology

## 2022-08-20 DIAGNOSIS — K219 Gastro-esophageal reflux disease without esophagitis: Secondary | ICD-10-CM

## 2022-08-22 DIAGNOSIS — Z1329 Encounter for screening for other suspected endocrine disorder: Secondary | ICD-10-CM | POA: Diagnosis not present

## 2022-08-22 DIAGNOSIS — Z0000 Encounter for general adult medical examination without abnormal findings: Secondary | ICD-10-CM | POA: Diagnosis not present

## 2022-08-22 DIAGNOSIS — E782 Mixed hyperlipidemia: Secondary | ICD-10-CM | POA: Diagnosis not present

## 2022-08-22 DIAGNOSIS — Z23 Encounter for immunization: Secondary | ICD-10-CM | POA: Diagnosis not present

## 2022-08-22 DIAGNOSIS — Z131 Encounter for screening for diabetes mellitus: Secondary | ICD-10-CM | POA: Diagnosis not present

## 2022-08-22 DIAGNOSIS — Z125 Encounter for screening for malignant neoplasm of prostate: Secondary | ICD-10-CM | POA: Diagnosis not present

## 2022-09-04 ENCOUNTER — Other Ambulatory Visit (HOSPITAL_BASED_OUTPATIENT_CLINIC_OR_DEPARTMENT_OTHER): Payer: Self-pay | Admitting: Family Medicine

## 2022-09-04 DIAGNOSIS — E7800 Pure hypercholesterolemia, unspecified: Secondary | ICD-10-CM

## 2022-09-18 ENCOUNTER — Ambulatory Visit (HOSPITAL_BASED_OUTPATIENT_CLINIC_OR_DEPARTMENT_OTHER)
Admission: RE | Admit: 2022-09-18 | Discharge: 2022-09-18 | Disposition: A | Discharge status: Home / Self Care | Payer: BC Managed Care – PPO | Source: Ambulatory Visit | Attending: Family Medicine | Admitting: Family Medicine

## 2022-09-18 DIAGNOSIS — E7800 Pure hypercholesterolemia, unspecified: Secondary | ICD-10-CM | POA: Insufficient documentation

## 2023-03-16 DIAGNOSIS — H6980 Other specified disorders of Eustachian tube, unspecified ear: Secondary | ICD-10-CM | POA: Diagnosis not present

## 2023-03-16 DIAGNOSIS — J039 Acute tonsillitis, unspecified: Secondary | ICD-10-CM | POA: Diagnosis not present

## 2023-08-04 DIAGNOSIS — Z125 Encounter for screening for malignant neoplasm of prostate: Secondary | ICD-10-CM | POA: Diagnosis not present

## 2023-08-04 DIAGNOSIS — R5383 Other fatigue: Secondary | ICD-10-CM | POA: Diagnosis not present

## 2023-08-04 DIAGNOSIS — Z131 Encounter for screening for diabetes mellitus: Secondary | ICD-10-CM | POA: Diagnosis not present

## 2023-08-04 DIAGNOSIS — E782 Mixed hyperlipidemia: Secondary | ICD-10-CM | POA: Diagnosis not present

## 2023-08-04 DIAGNOSIS — Z0000 Encounter for general adult medical examination without abnormal findings: Secondary | ICD-10-CM | POA: Diagnosis not present

## 2023-08-18 DIAGNOSIS — E291 Testicular hypofunction: Secondary | ICD-10-CM | POA: Diagnosis not present

## 2023-10-05 DIAGNOSIS — J209 Acute bronchitis, unspecified: Secondary | ICD-10-CM | POA: Diagnosis not present

## 2023-10-05 DIAGNOSIS — Z03818 Encounter for observation for suspected exposure to other biological agents ruled out: Secondary | ICD-10-CM | POA: Diagnosis not present

## 2023-10-15 DIAGNOSIS — H524 Presbyopia: Secondary | ICD-10-CM | POA: Diagnosis not present

## 2023-10-15 DIAGNOSIS — H5203 Hypermetropia, bilateral: Secondary | ICD-10-CM | POA: Diagnosis not present

## 2024-01-12 DIAGNOSIS — M25552 Pain in left hip: Secondary | ICD-10-CM | POA: Diagnosis not present

## 2024-02-02 DIAGNOSIS — M25552 Pain in left hip: Secondary | ICD-10-CM | POA: Diagnosis not present

## 2024-02-18 DIAGNOSIS — M25552 Pain in left hip: Secondary | ICD-10-CM | POA: Diagnosis not present

## 2024-03-09 ENCOUNTER — Ambulatory Visit: Admitting: Gastroenterology

## 2024-03-09 ENCOUNTER — Encounter: Payer: Self-pay | Admitting: Gastroenterology

## 2024-03-09 VITALS — BP 110/72 | HR 80 | Ht 69.5 in | Wt 212.1 lb

## 2024-03-09 DIAGNOSIS — Z860101 Personal history of adenomatous and serrated colon polyps: Secondary | ICD-10-CM | POA: Diagnosis not present

## 2024-03-09 DIAGNOSIS — K219 Gastro-esophageal reflux disease without esophagitis: Secondary | ICD-10-CM

## 2024-03-09 DIAGNOSIS — Z860100 Personal history of colon polyps, unspecified: Secondary | ICD-10-CM

## 2024-03-09 DIAGNOSIS — R1310 Dysphagia, unspecified: Secondary | ICD-10-CM | POA: Diagnosis not present

## 2024-03-09 DIAGNOSIS — K449 Diaphragmatic hernia without obstruction or gangrene: Secondary | ICD-10-CM | POA: Diagnosis not present

## 2024-03-09 MED ORDER — PANTOPRAZOLE SODIUM 40 MG PO TBEC: 40.0000 mg | DELAYED_RELEASE_TABLET | Freq: Every day | ORAL | 4 refills | Status: DC

## 2024-03-09 NOTE — Patient Instructions (Addendum)
 _______________________________________________________  If your blood pressure at your visit was 140/90 or greater, please contact your primary care physician to follow up on this.  _______________________________________________________  If you are age 53 or older, your body mass index should be between 23-30. Your Body mass index is 30.88 kg/m. If this is out of the aforementioned range listed, please consider follow up with your Primary Care Provider.  If you are age 40 or younger, your body mass index should be between 19-25. Your Body mass index is 30.88 kg/m. If this is out of the aformentioned range listed, please consider follow up with your Primary Care Provider.   ________________________________________________________  The [facility] providers would like to encourage you to use MYCHART to communicate with providers for non-urgent requests or questions.  Due to long hold times on the telephone, sending your provider a message by [facility] may be a faster and more efficient way to get a response.  Please allow 48 business hours for a response.  Please remember that this is for non-urgent requests.  _______________________________________________________  We have sent the following medications to your pharmacy for you to pick up at your convenience: Protonix  40mg  daily  Chew food slowly and very well  Repeat colonoscopy for 09-2025. Please call 2 months prior to schedule this. A letter will be sent as it gets closer.  You have been scheduled for a Barium Esophogram at [facility] (1st floor of the hospital) on 04-07-24 at 9am. Please arrive 15 minutes prior to your appointment for registration. Make certain not to have anything to eat or drink 3 hours prior to your test. If you need to reschedule for any reason, please contact radiology at [phone] to do so. __________________________________________________________________ A barium swallow is an examination that  concentrates on views of the esophagus. This tends to be a double contrast exam (barium and two liquids which, when combined, create a gas to distend the wall of the oesophagus) or single contrast (non-ionic iodine based). The study is usually tailored to your symptoms so a good history is essential. Attention is paid during the study to the form, structure and configuration of the esophagus, looking for functional disorders (such as aspiration, dysphagia, achalasia, motility and reflux) EXAMINATION You may be asked to change into a gown, depending on the type of swallow being performed. A radiologist and radiographer will perform the procedure. The radiologist will advise you of the type of contrast selected for your procedure and direct you during the exam. You will be asked to stand, sit or lie in several different positions and to hold a small amount of fluid in your mouth before being asked to swallow while the imaging is performed .In some instances you may be asked to swallow barium coated marshmallows to assess the motility of a solid food bolus. The exam can be recorded as a digital or video fluoroscopy procedure. POST PROCEDURE It will take 1-2 days for the barium to pass through your system. To facilitate this, it is important, unless otherwise directed, to increase your fluids for the next 24-48hrs and to resume your normal diet.  This test typically takes about 30 minutes to perform. __________________________________________________________________________________  Rosine have been scheduled for an endoscopy. Please follow written instructions given to you at your visit today.  If you use inhalers (even only as needed), please bring them with you on the day of your procedure.  If you take any of the following medications, they will need to be adjusted prior to  your procedure:   DO NOT TAKE 7 DAYS PRIOR TO TEST- Trulicity (dulaglutide) Ozempic, Wegovy (semaglutide) Mounjaro  (tirzepatide) Bydureon Bcise (exanatide extended release)  DO NOT TAKE 1 DAY PRIOR TO YOUR TEST Rybelsus (semaglutide) Adlyxin (lixisenatide) Victoza (liraglutide) Byetta (exanatide) ___________________________________________________________________________   Thank you,  Dr. Lynnie Bring

## 2024-03-09 NOTE — Progress Notes (Signed)
 Chief Complaint: Dysphagia  Referring Provider:  Regino Slater, MD      ASSESSMENT AND PLAN;   #1. Dysphagia and GERD with small HH.  Suspected recurrent esophageal stricture. Bx neg for EoE  #2. H/O sessile serrated polyp. A 5-year interval surveillance colonoscopy is recommended in January 2027.    Plan: -Protonix  40mg  po every day #90, 4RF -Ba swallow with tab -EGD with dil -Chew food well and eat slowly -Recall colon Jan 2027   Proceed with EGD. I have discussed the risks and benefits. The risks including rare risk of perforation, bleeding, missed UGI neoplasms, risks of anesthesia/sedation. Alternatives were given. Patient is aware and agrees to proceed. All the questions were answered. This will be scheduled in upcoming days. Consent forms were given for review. HPI:    Marc Weber is a 53 y.o. male   Discussed the use of AI scribe software for clinical note transcription with the patient, who gave verbal consent to proceed.  History of Present Illness Marc Weber is a 53 year old male with esophageal stricture who presents with dysphagia.  He has been experiencing dysphagia for over six months, with symptoms progressively worsening. Food gets stuck in his esophagus, particularly when eating rice, chicken, and dry foods. He acknowledges that his eating habits, such as eating quickly, may contribute to the problem. He has undergone esophageal dilation twice, in 2019 and 2022, and is currently experiencing similar symptoms.  He is currently taking Pantoprazole  (Protonix ) and it helps manage his symptoms, particularly heartburn. If he misses doses for two days, he experiences a return of symptoms. He has not needed to induce vomiting, although he has attempted to do so in severe cases.  He mentions a past concern about sleep apnea, which he addressed by reducing alcohol intake and making lifestyle changes, leading to an improvement in symptoms. He has lost some  weight and exercises regularly, going to the gym three times a week. He also takes a sleep aid nightly.  He reports eating dinner early, around 5:00 to 5:30 PM, to aid digestion and occasionally elevates the head of his bed to improve sleep quality. He has not had a barium swallow test before. No current symptoms of sleep apnea after lifestyle changes.  Denies having any diarrhea or constipation.  No odynophagia, recent weight loss.  Does not take nonsteroidals.    Wt Readings from Last 3 Encounters:  03/09/24 212 lb 2 oz (96.2 kg)  04/05/21 214 lb (97.1 kg)  02/19/21 214 lb (97.1 kg)   Past GI workup:  EGD with dilatation 04/05/2021 - Benign- appearing esophageal stenosis. Dilated 16 mm savory. - Normal stomach. - Normal duodenal bulb and second portion of the duodenum. - No specimens collected.  Colonoscopy 09/05/2020 - Six 6 to 8 mm polyps in the sigmoid colon, in the descending colon and in the transverse colon, removed with a cold snare. Resected and retrieved. - Internal hemorrhoids. - The examination was otherwise normal on direct and retroflexion views. - Bx; mostly hyperplastic, 1 SSA  EGD January 2019 -- Esophageal mucosal changes suspicious for eosinophilic esophagitis. Biopsied. - Benign- appearing esophageal stenosis. Dilated 16 mm. - Normal stomach. - Normal duodenal bulb and second portion of the duodenum. - Bx- neg for EoE  SH- build houses Past Medical History:  Diagnosis Date   Depression    or stress- unsure- takes Effexor   Esophageal stricture    GERD (gastroesophageal reflux disease)    Hypercholesteremia  no meds   Pericarditis    2017   Serrated adenoma of colon 09/2020    Past Surgical History:  Procedure Laterality Date   LEFT HEART CATH AND CORONARY ANGIOGRAPHY N/A 10/28/2016   Procedure: Left Heart Cath and Coronary Angiography;  Surgeon: Ozell Fell, MD;  Location: [facility] INVASIVE CV LAB;  Service: Cardiovascular;  Laterality: N/A;   RHINOPLASTY      UPPER GASTROINTESTINAL ENDOSCOPY      Family History  Problem Relation Age of Onset   Alcohol abuse Mother    Suicidality Father    Heart attack Father    Colon cancer Neg Hx    Esophageal cancer Neg Hx    Stomach cancer Neg Hx    Rectal cancer Neg Hx     Social History   Tobacco Use   Smoking status: Former   Smokeless tobacco: Never  Vaping Use   Vaping status: Never Used  Substance Use Topics   Alcohol use: No    Comment: wine- rare   Drug use: No    Current Outpatient Medications  Medication Sig Dispense Refill   pantoprazole  (PROTONIX ) 40 MG tablet TAKE 1 TABLET(40 MG) BY MOUTH DAILY 30 tablet 11   venlafaxine XR (EFFEXOR-XR) 75 MG 24 hr capsule Take 75 mg by mouth daily.  11   Current Facility-Administered Medications  Medication Dose Route Frequency Provider Last Rate Last Admin   0.9 %  sodium chloride  infusion  500 mL Intravenous Once Aneita Gwendlyn DASEN, MD        Allergies  Allergen Reactions   Celexa [Citalopram]     dizzines   Zoloft [Sertraline]     dizziness    Review of Systems:  Constitutional: Denies fever, chills, diaphoresis, appetite change and fatigue.  HEENT: neg  Cardiovascular: Denies chest pain, palpitations and leg swelling.  Genitourinary: Denies dysuria, urgency, frequency, hematuria, flank pain and difficulty urinating.  Musculoskeletal: Denies myalgias, back pain, joint swelling, arthralgias and gait problem.  Skin: No rash.  Neurological: Denies dizziness, seizures, syncope, weakness, light-headedness, numbness and headaches.      Physical Exam:    BP 110/72 (BP Location: Left Arm, Patient Position: Sitting, Cuff Size: Large)   Pulse 80   Ht 5' 9.5 (1.765 m) Comment: height measured without shoes  Wt 212 lb 2 oz (96.2 kg)   BMI 30.88 kg/m  Wt Readings from Last 3 Encounters:  03/09/24 212 lb 2 oz (96.2 kg)  04/05/21 214 lb (97.1 kg)  02/19/21 214 lb (97.1 kg)   Constitutional:  Well-developed, in no acute  distress. Psychiatric: Normal mood and affect. Behavior is normal. HEENT: Pupils normal.  Conjunctivae are normal. No scleral icterus. Cardiovascular: Normal rate, regular rhythm. No edema Pulmonary/chest: Effort normal and breath sounds normal. No wheezing, rales or rhonchi. Abdominal: Soft, nondistended. Nontender. Bowel sounds active throughout. There are no masses palpable. No hepatomegaly. Rectal: Deferred Neurological: Alert and oriented to person place and time. Skin: Skin is warm and dry. No rashes noted.  Data Reviewed: I have personally reviewed following labs and imaging studies  CBC:    Latest Ref Rng & Units 10/29/2016    2:34 AM 10/28/2016    9:55 AM 10/28/2016    9:25 AM  CBC  WBC 4.0 - 10.5 K/uL 6.2   8.8   Hemoglobin 13.0 - 17.0 g/dL 86.3  85.6  84.6   Hematocrit 39.0 - 52.0 % 41.1  42.0  45.5   Platelets 150 - 400 K/uL 181   208  CMP:    Latest Ref Rng & Units 10/29/2016    2:34 AM 10/28/2016    9:55 AM 10/28/2016    9:25 AM  CMP  Glucose 65 - 99 mg/dL 898  886  897   BUN 6 - 20 mg/dL 11  18  16    Creatinine 0.61 - 1.24 mg/dL 9.09  9.19  9.03   Sodium 135 - 145 mmol/L 139  140  139   Potassium 3.5 - 5.1 mmol/L 3.9  3.8  4.2   Chloride 101 - 111 mmol/L 105  105  102   CO2 22 - 32 mmol/L 27   26   Calcium 8.9 - 10.3 mg/dL 9.0   9.9   Total Protein 6.5 - 8.1 g/dL   6.8   Total Bilirubin 0.3 - 1.2 mg/dL   0.8   Alkaline Phos 38 - 126 U/L   53   AST 15 - 41 U/L   20   ALT 17 - 63 U/L   16         Anselm Bring, MD 03/09/2024, 10:04 AM  Cc: Regino Slater, MD

## 2024-04-07 ENCOUNTER — Ambulatory Visit (HOSPITAL_COMMUNITY)
Admission: RE | Admit: 2024-04-07 | Discharge: 2024-04-07 | Disposition: A | Discharge status: Home / Self Care | Source: Ambulatory Visit | Attending: Gastroenterology | Admitting: Gastroenterology

## 2024-04-07 DIAGNOSIS — R1310 Dysphagia, unspecified: Secondary | ICD-10-CM | POA: Insufficient documentation

## 2024-04-07 DIAGNOSIS — K449 Diaphragmatic hernia without obstruction or gangrene: Secondary | ICD-10-CM | POA: Insufficient documentation

## 2024-04-07 DIAGNOSIS — K219 Gastro-esophageal reflux disease without esophagitis: Secondary | ICD-10-CM | POA: Insufficient documentation

## 2024-04-07 DIAGNOSIS — K2289 Other specified disease of esophagus: Secondary | ICD-10-CM | POA: Diagnosis not present

## 2024-04-13 ENCOUNTER — Ambulatory Visit: Payer: Self-pay | Admitting: Gastroenterology

## 2024-05-05 DIAGNOSIS — M722 Plantar fascial fibromatosis: Secondary | ICD-10-CM | POA: Diagnosis not present

## 2024-05-10 ENCOUNTER — Other Ambulatory Visit: Payer: Self-pay | Admitting: Gastroenterology

## 2024-05-10 ENCOUNTER — Ambulatory Visit: Admitting: Gastroenterology

## 2024-05-10 ENCOUNTER — Encounter: Payer: Self-pay | Admitting: Gastroenterology

## 2024-05-10 VITALS — BP 94/62 | HR 65 | Temp 97.2°F | Resp 10 | Ht 69.5 in | Wt 212.2 lb

## 2024-05-10 DIAGNOSIS — K2289 Other specified disease of esophagus: Secondary | ICD-10-CM | POA: Diagnosis not present

## 2024-05-10 DIAGNOSIS — R1310 Dysphagia, unspecified: Secondary | ICD-10-CM | POA: Diagnosis not present

## 2024-05-10 DIAGNOSIS — K222 Esophageal obstruction: Secondary | ICD-10-CM | POA: Diagnosis not present

## 2024-05-10 DIAGNOSIS — K200 Eosinophilic esophagitis: Secondary | ICD-10-CM | POA: Diagnosis not present

## 2024-05-10 DIAGNOSIS — K219 Gastro-esophageal reflux disease without esophagitis: Secondary | ICD-10-CM | POA: Diagnosis not present

## 2024-05-10 DIAGNOSIS — K449 Diaphragmatic hernia without obstruction or gangrene: Secondary | ICD-10-CM | POA: Diagnosis not present

## 2024-05-10 MED ORDER — SODIUM CHLORIDE 0.9 % IV SOLN: 500.0000 mL | Freq: Once | INTRAVENOUS | Status: DC

## 2024-05-10 NOTE — Patient Instructions (Signed)
 Educational handout provided to patient related to post dilation diet  POST DILATION DIET  Continue present medications  Awaiting pathology results  YOU HAD AN ENDOSCOPIC PROCEDURE TODAY AT THE [facility]:   Refer to the procedure report that was given to you for any specific questions about what was found during the examination.  If the procedure report does not answer your questions, please call your gastroenterologist to clarify.  If you requested that your care partner not be given the details of your procedure findings, then the procedure report has been included in a sealed envelope for you to review at your convenience later.  YOU SHOULD EXPECT: Some feelings of bloating in the abdomen. Passage of more gas than usual.  Walking can help get rid of the air that was put into your GI tract during the procedure and reduce the bloating. If you had a lower endoscopy (such as a colonoscopy or flexible sigmoidoscopy) you may notice spotting of blood in your stool or on the toilet paper. If you underwent a bowel prep for your procedure, you may not have a normal bowel movement for a few days.  Please Note:  You might notice some irritation and congestion in your nose or some drainage.  This is from the oxygen used during your procedure.  There is no need for concern and it should clear up in a day or so.  SYMPTOMS TO REPORT IMMEDIATELY:  Following upper endoscopy (EGD)  Vomiting of blood or coffee ground material  New chest pain or pain under the shoulder blades  Painful or persistently difficult swallowing  New shortness of breath  Fever of 100F or higher  Black, tarry-looking stools  For urgent or emergent issues, a gastroenterologist can be reached at any hour by calling [phone]. Do not use MyChart messaging for urgent concerns.    DIET:  We do recommend a small meal at first, but then you may proceed to your regular diet.  Drink plenty of fluids but you should avoid  alcoholic beverages for 24 hours.  ACTIVITY:  You should plan to take it easy for the rest of today and you should NOT DRIVE or use heavy machinery until tomorrow (because of the sedation medicines used during the test).    FOLLOW UP: Our staff will call the number listed on your records the next business day following your procedure.  We will call around 7:15- 8:00 am to check on you and address any questions or concerns that you may have regarding the information given to you following your procedure. If we do not reach you, we will leave a message.     If any biopsies were taken you will be contacted by phone or by letter within the next 1-3 weeks.  Please call us  at [phone] if you have not heard about the biopsies in 3 weeks.    SIGNATURES/CONFIDENTIALITY: You and/or your care partner have signed paperwork which will be entered into your electronic medical record.  These signatures attest to the fact that that the information above on your After Visit Summary has been reviewed and is understood.  Full responsibility of the confidentiality of this discharge information lies with you and/or your care-partner.

## 2024-05-10 NOTE — Op Note (Signed)
 [facility] Patient Name: Marc Weber Procedure Date: 05/10/2024 8:31 AM MRN: [id number] Endoscopist: Lynnie Bring , MD, [id number] Age: 53 Referring MD:  Date of Birth: March 24, 1971 Gender: Male Account #: [id number] Procedure:                Upper GI endoscopy Indications:              Dysphagia with barium swallow showing Schatzki's                            ring. Medicines:                Monitored Anesthesia Care Procedure:                Pre-Anesthesia Assessment:                           - Prior to the procedure, a History and Physical                            was performed, and patient medications and                            allergies were reviewed. The patient's tolerance of                            previous anesthesia was also reviewed. The risks                            and benefits of the procedure and the sedation                            options and risks were discussed with the patient.                            All questions were answered, and informed consent                            was obtained. Prior Anticoagulants: The patient has                            taken no anticoagulant or antiplatelet agents. ASA                            Grade Assessment: II - A patient with mild systemic                            disease. After reviewing the risks and benefits,                            the patient was deemed in satisfactory condition to                            undergo the procedure.  After obtaining informed consent, the endoscope was                            passed under direct vision. Throughout the                            procedure, the patient's blood pressure, pulse, and                            oxygen saturations were monitored continuously. The                            GIF [id number] #[id number] was introduced through the                            mouth, and advanced to the second part of duodenum.                             The upper GI endoscopy was accomplished without                            difficulty. The patient tolerated the procedure                            well. Scope In: Scope Out: Findings:                 The examined esophagus was normal. A few circular                            longitudinal folds were visualized in the mid                            esophagus. Biopsies were obtained from the                            proximal/mid and distal esophagus with cold forceps                            for histology to r/o eosinophilic esophagitis.                           A moderate Schatzki ring was found at the                            gastroesophageal junction. The scope was withdrawn.                            Dilation was performed with a Maloney dilator with                            mild resistance at 52 Fr and 54 Fr.                           A  small hiatal hernia was present.                           The exam was otherwise without abnormality. Complications:            No immediate complications. Estimated Blood Loss:     Estimated blood loss: none. Impression:               - Moderate Schatzki ring. Dilated.                           - Small hiatal hernia.                           - The examination was otherwise normal.                           - Biopsies were taken with a cold forceps for                            evaluation of eosinophilic esophagitis. Recommendation:           - Patient has a contact number available for                            emergencies. The signs and symptoms of potential                            delayed complications were discussed with the                            patient. Return to normal activities tomorrow.                            Written discharge instructions were provided to the                            patient.                           - Postdilatation diet.                           - Continue present  medications including Protonix                             40 mg p.o. daily.                           - Await pathology results.                           - Chew foods especially meats and breads and eat                            slowly.                           -  The findings and recommendations were discussed                            with the patient's family. Lynnie Bring, MD 05/10/2024 8:53:56 AM This report has been signed electronically.

## 2024-05-10 NOTE — Progress Notes (Signed)
 Report to PACU, RN, vss, BBS= Clear.

## 2024-05-10 NOTE — Progress Notes (Signed)
 Chief Complaint: Dysphagia  Referring Provider:  Regino Slater, MD      ASSESSMENT AND PLAN;   #1. Dysphagia and GERD with small HH.  Suspected recurrent esophageal stricture. Bx neg for EoE  #2. H/O sessile serrated polyp. A 5-year interval surveillance colonoscopy is recommended in January 2027.    Plan: -Protonix  40mg  po every day #90, 4RF -Ba swallow with tab -EGD with dil -Chew food well and eat slowly -Recall colon Jan 2027   Proceed with EGD. I have discussed the risks and benefits. The risks including rare risk of perforation, bleeding, missed UGI neoplasms, risks of anesthesia/sedation. Alternatives were given. Patient is aware and agrees to proceed. All the questions were answered. This will be scheduled in upcoming days. Consent forms were given for review. HPI:    Marc Weber is a 53 y.o. male   Discussed the use of AI scribe software for clinical note transcription with the patient, who gave verbal consent to proceed.  History of Present Illness Marc Weber is a 53 year old male with esophageal stricture who presents with dysphagia.  He has been experiencing dysphagia for over six months, with symptoms progressively worsening. Food gets stuck in his esophagus, particularly when eating rice, chicken, and dry foods. He acknowledges that his eating habits, such as eating quickly, may contribute to the problem. He has undergone esophageal dilation twice, in 2019 and 2022, and is currently experiencing similar symptoms.  He is currently taking Pantoprazole  (Protonix ) and it helps manage his symptoms, particularly heartburn. If he misses doses for two days, he experiences a return of symptoms. He has not needed to induce vomiting, although he has attempted to do so in severe cases.  He mentions a past concern about sleep apnea, which he addressed by reducing alcohol intake and making lifestyle changes, leading to an improvement in symptoms. He has lost some  weight and exercises regularly, going to the gym three times a week. He also takes a sleep aid nightly.  He reports eating dinner early, around 5:00 to 5:30 PM, to aid digestion and occasionally elevates the head of his bed to improve sleep quality. He has not had a barium swallow test before. No current symptoms of sleep apnea after lifestyle changes.  Denies having any diarrhea or constipation.  No odynophagia, recent weight loss.  Does not take nonsteroidals.    Wt Readings from Last 3 Encounters:  05/10/24 212 lb 3.2 oz (96.3 kg)  03/09/24 212 lb 2 oz (96.2 kg)  04/05/21 214 lb (97.1 kg)   Past GI workup:  EGD with dilatation 04/05/2021 - Benign- appearing esophageal stenosis. Dilated 16 mm savory. - Normal stomach. - Normal duodenal bulb and second portion of the duodenum. - No specimens collected.  Colonoscopy 09/05/2020 - Six 6 to 8 mm polyps in the sigmoid colon, in the descending colon and in the transverse colon, removed with a cold snare. Resected and retrieved. - Internal hemorrhoids. - The examination was otherwise normal on direct and retroflexion views. - Bx; mostly hyperplastic, 1 SSA  EGD January 2019 -- Esophageal mucosal changes suspicious for eosinophilic esophagitis. Biopsied. - Benign- appearing esophageal stenosis. Dilated 16 mm. - Normal stomach. - Normal duodenal bulb and second portion of the duodenum. - Bx- neg for EoE  SH- build houses Past Medical History:  Diagnosis Date   Depression    or stress- unsure- takes Effexor   Esophageal stricture    GERD (gastroesophageal reflux disease)    Hypercholesteremia  no meds   Pericarditis    2017   Serrated adenoma of colon 09/2020    Past Surgical History:  Procedure Laterality Date   LEFT HEART CATH AND CORONARY ANGIOGRAPHY N/A 10/28/2016   Procedure: Left Heart Cath and Coronary Angiography;  Surgeon: Ozell Fell, MD;  Location: [facility] INVASIVE CV LAB;  Service: Cardiovascular;  Laterality: N/A;    RHINOPLASTY     UPPER GASTROINTESTINAL ENDOSCOPY      Family History  Problem Relation Age of Onset   Alcohol abuse Mother    Suicidality Father    Heart attack Father    Colon cancer Neg Hx    Esophageal cancer Neg Hx    Stomach cancer Neg Hx    Rectal cancer Neg Hx     Social History   Tobacco Use   Smoking status: Former   Smokeless tobacco: Never  Vaping Use   Vaping status: Never Used  Substance Use Topics   Alcohol use: No    Comment: wine- rare   Drug use: No    Current Outpatient Medications  Medication Sig Dispense Refill   meloxicam (MOBIC) 15 MG tablet Take 15 mg by mouth daily.     pantoprazole  (PROTONIX ) 40 MG tablet TAKE 1 TABLET(40 MG) BY MOUTH DAILY 30 tablet 11   venlafaxine XR (EFFEXOR-XR) 75 MG 24 hr capsule Take 75 mg by mouth daily.  11   Current Facility-Administered Medications  Medication Dose Route Frequency Provider Last Rate Last Admin   0.9 %  sodium chloride  infusion  500 mL Intravenous Once Charlanne Groom, MD        Allergies  Allergen Reactions   Celexa [Citalopram]     dizzines   Zoloft [Sertraline]     dizziness    Review of Systems:  Constitutional: Denies fever, chills, diaphoresis, appetite change and fatigue.  HEENT: neg  Cardiovascular: Denies chest pain, palpitations and leg swelling.  Genitourinary: Denies dysuria, urgency, frequency, hematuria, flank pain and difficulty urinating.  Musculoskeletal: Denies myalgias, back pain, joint swelling, arthralgias and gait problem.  Skin: No rash.  Neurological: Denies dizziness, seizures, syncope, weakness, light-headedness, numbness and headaches.      Physical Exam:    BP 114/79   Pulse (!) 56   Temp (!) 97.2 F (36.2 C)   Resp 13   Ht 5' 9.5 (1.765 m)   Wt 212 lb 3.2 oz (96.3 kg)   SpO2 100%   BMI 30.89 kg/m  Wt Readings from Last 3 Encounters:  05/10/24 212 lb 3.2 oz (96.3 kg)  03/09/24 212 lb 2 oz (96.2 kg)  04/05/21 214 lb (97.1 kg)   Constitutional:   Well-developed, in no acute distress. Psychiatric: Normal mood and affect. Behavior is normal. HEENT: Pupils normal.  Conjunctivae are normal. No scleral icterus. Cardiovascular: Normal rate, regular rhythm. No edema Pulmonary/chest: Effort normal and breath sounds normal. No wheezing, rales or rhonchi. Abdominal: Soft, nondistended. Nontender. Bowel sounds active throughout. There are no masses palpable. No hepatomegaly. Rectal: Deferred Neurological: Alert and oriented to person place and time. Skin: Skin is warm and dry. No rashes noted.  Data Reviewed: I have personally reviewed following labs and imaging studies  CBC:    Latest Ref Rng & Units 10/29/2016    2:34 AM 10/28/2016    9:55 AM 10/28/2016    9:25 AM  CBC  WBC 4.0 - 10.5 K/uL 6.2   8.8   Hemoglobin 13.0 - 17.0 g/dL 86.3  85.6  84.6   Hematocrit 39.0 -  52.0 % 41.1  42.0  45.5   Platelets 150 - 400 K/uL 181   208     CMP:    Latest Ref Rng & Units 10/29/2016    2:34 AM 10/28/2016    9:55 AM 10/28/2016    9:25 AM  CMP  Glucose 65 - 99 mg/dL 898  886  897   BUN 6 - 20 mg/dL 11  18  16    Creatinine 0.61 - 1.24 mg/dL 9.09  9.19  9.03   Sodium 135 - 145 mmol/L 139  140  139   Potassium 3.5 - 5.1 mmol/L 3.9  3.8  4.2   Chloride 101 - 111 mmol/L 105  105  102   CO2 22 - 32 mmol/L 27   26   Calcium 8.9 - 10.3 mg/dL 9.0   9.9   Total Protein 6.5 - 8.1 g/dL   6.8   Total Bilirubin 0.3 - 1.2 mg/dL   0.8   Alkaline Phos 38 - 126 U/L   53   AST 15 - 41 U/L   20   ALT 17 - 63 U/L   16         Anselm Bring, MD 05/10/2024, 8:36 AM  Cc: Regino Slater, MD

## 2024-05-10 NOTE — Progress Notes (Signed)
 Called to room to assist during endoscopic procedure.  Patient ID and intended procedure confirmed with present staff. Received instructions for my participation in the procedure from the performing physician.

## 2024-05-11 ENCOUNTER — Telehealth: Payer: Self-pay

## 2024-05-11 NOTE — Telephone Encounter (Signed)
 Left message

## 2024-05-12 LAB — SURGICAL PATHOLOGY

## 2024-05-15 ENCOUNTER — Ambulatory Visit: Payer: Self-pay | Admitting: Gastroenterology

## 2024-05-18 ENCOUNTER — Other Ambulatory Visit: Payer: Self-pay

## 2024-05-18 ENCOUNTER — Encounter: Payer: Self-pay | Admitting: Gastroenterology

## 2024-05-18 DIAGNOSIS — K200 Eosinophilic esophagitis: Secondary | ICD-10-CM

## 2024-05-18 MED ORDER — FLUTICASONE PROPIONATE HFA 110 MCG/ACT IN AERO: INHALATION_SPRAY | RESPIRATORY_TRACT | 3 refills | Status: AC

## 2024-05-19 ENCOUNTER — Other Ambulatory Visit (HOSPITAL_COMMUNITY): Payer: Self-pay

## 2024-05-19 ENCOUNTER — Telehealth: Payer: Self-pay

## 2024-05-19 NOTE — Telephone Encounter (Signed)
 Pharmacy Patient Advocate Encounter   Received notification from CoverMyMeds that prior authorization for Fluticasone  Propionate HFA 110MCG/ACT aerosol is required/requested.   Insurance verification completed.   The patient is insured through [facility] .   Per test claim: PA required; PA submitted to above mentioned insurance via Latent Key/confirmation #/EOC [id number] Status is pending

## 2024-05-20 NOTE — Telephone Encounter (Signed)
 Pharmacy Patient Advocate Encounter  Received notification from [facility] that Prior Authorization for Fluticasone  Propionate HFA 110MCG/ACT aerosol has been DENIED.  Full denial letter will be uploaded to the media tab. See denial reason below.  This medication may be covered when two alternative medications from the [facility] formulary have been tried and did not work, or the member cannot take ALL alternatives (due to interactions, side effects, etc).  In this case, there is only one formulary alternative that must be tried:budesonide inhalation suspension (generic Pulmicort)  PA #/Case ID/Reference #: [id number]

## 2024-05-20 NOTE — Telephone Encounter (Signed)
 Patient's flovent  inhaler has been denied by insurance. Denial listed below & includes In this case, there is only one formulary alternative that must be tried:budesonide inhalation suspension (generic Pulmicort)

## 2024-05-25 NOTE — Telephone Encounter (Signed)
 Lets do budesonide  suspension 2 mg p.o. twice daily x 12 weeks.  Take slowly over 5 to 10 minutes and do not eat or drink for 30 minutes thereafter. RG

## 2024-05-26 ENCOUNTER — Other Ambulatory Visit: Payer: Self-pay

## 2024-05-26 ENCOUNTER — Telehealth: Payer: Self-pay

## 2024-05-26 ENCOUNTER — Other Ambulatory Visit (HOSPITAL_COMMUNITY): Payer: Self-pay

## 2024-05-26 MED ORDER — BUDESONIDE 2 MG/10ML PO SUSP: 2.0000 mg | Freq: Two times a day (BID) | ORAL | 2 refills | Status: AC

## 2024-05-26 NOTE — Telephone Encounter (Signed)
 Pharmacy Patient Advocate Encounter   Received notification from CoverMyMeds that prior authorization for Eohilia  2MG /10ML suspension is required/requested.   Insurance verification completed.   The patient is insured through [facility] .   Per test claim: PA required; PA submitted to above mentioned insurance via Latent Key/confirmation #/EOC [id number] Status is pending

## 2024-05-26 NOTE — Telephone Encounter (Signed)
 Prescription sent to pharmacy &  message sent to pt.

## 2024-05-27 ENCOUNTER — Other Ambulatory Visit (HOSPITAL_COMMUNITY): Payer: Self-pay

## 2024-05-27 NOTE — Telephone Encounter (Signed)
 Pharmacy Patient Advocate Encounter  Received notification from [facility] that Prior Authorization for Eohilia  2MG /10ML suspension has been APPROVED from 05-26-2024 to 05-26-2025. Ran test claim, Copay is $5.00. This test claim was processed through [facility]- copay amounts may vary at other pharmacies due to pharmacy/plan contracts, or as the patient moves through the different stages of their insurance plan.   PA #/Case ID/Reference #: [id number]

## 2024-05-27 NOTE — Telephone Encounter (Signed)
 Left message for pt to call back

## 2024-05-30 NOTE — Telephone Encounter (Signed)
 Pt made aware  that Prior Authorization for Eohilia  2MG /10ML suspension has been APPROVED  Pt verbalized understanding with all questions answered.

## 2024-07-05 DIAGNOSIS — H9201 Otalgia, right ear: Secondary | ICD-10-CM | POA: Diagnosis not present

## 2024-07-05 DIAGNOSIS — M542 Cervicalgia: Secondary | ICD-10-CM | POA: Diagnosis not present

## 2024-08-11 DIAGNOSIS — Z0000 Encounter for general adult medical examination without abnormal findings: Secondary | ICD-10-CM | POA: Diagnosis not present

## 2024-08-11 DIAGNOSIS — F321 Major depressive disorder, single episode, moderate: Secondary | ICD-10-CM | POA: Diagnosis not present

## 2024-08-11 DIAGNOSIS — G4730 Sleep apnea, unspecified: Secondary | ICD-10-CM | POA: Diagnosis not present

## 2024-08-11 DIAGNOSIS — Z131 Encounter for screening for diabetes mellitus: Secondary | ICD-10-CM | POA: Diagnosis not present
